# Patient Record
Sex: Male | Born: 1955 | Race: Black or African American | Hispanic: No | Smoking: Former smoker
Health system: Southern US, Community
[De-identification: ages and names within clinical notes are randomized; demographics above are authoritative.]

## PROBLEM LIST (undated history)

## (undated) DIAGNOSIS — K5900 Constipation, unspecified: Secondary | ICD-10-CM

## (undated) DIAGNOSIS — Z21 Asymptomatic human immunodeficiency virus [HIV] infection status: Secondary | ICD-10-CM

## (undated) DIAGNOSIS — E559 Vitamin D deficiency, unspecified: Secondary | ICD-10-CM

## (undated) DIAGNOSIS — N400 Enlarged prostate without lower urinary tract symptoms: Secondary | ICD-10-CM

## (undated) DIAGNOSIS — E049 Nontoxic goiter, unspecified: Secondary | ICD-10-CM

## (undated) DIAGNOSIS — K219 Gastro-esophageal reflux disease without esophagitis: Secondary | ICD-10-CM

## (undated) HISTORY — DX: Vitamin D deficiency, unspecified: E55.9

## (undated) HISTORY — DX: Constipation, unspecified: K59.00

## (undated) HISTORY — DX: Gastro-esophageal reflux disease without esophagitis: K21.9

## (undated) HISTORY — DX: Nontoxic goiter, unspecified: E04.9

## (undated) HISTORY — DX: Benign prostatic hyperplasia without lower urinary tract symptoms: N40.0

---

## 2013-12-26 ENCOUNTER — Encounter (HOSPITAL_COMMUNITY): Payer: Self-pay | Admitting: Emergency Medicine

## 2013-12-26 ENCOUNTER — Emergency Department (HOSPITAL_COMMUNITY): Payer: Non-veteran care

## 2013-12-26 ENCOUNTER — Emergency Department (HOSPITAL_COMMUNITY)
Admission: EM | Admit: 2013-12-26 | Discharge: 2013-12-26 | Disposition: A | Discharge status: Home / Self Care | Payer: Non-veteran care | Attending: Emergency Medicine | Admitting: Emergency Medicine

## 2013-12-26 DIAGNOSIS — R0789 Other chest pain: Secondary | ICD-10-CM

## 2013-12-26 DIAGNOSIS — R0602 Shortness of breath: Secondary | ICD-10-CM | POA: Insufficient documentation

## 2013-12-26 DIAGNOSIS — R071 Chest pain on breathing: Secondary | ICD-10-CM | POA: Insufficient documentation

## 2013-12-26 DIAGNOSIS — Z21 Asymptomatic human immunodeficiency virus [HIV] infection status: Secondary | ICD-10-CM | POA: Insufficient documentation

## 2013-12-26 DIAGNOSIS — F17200 Nicotine dependence, unspecified, uncomplicated: Secondary | ICD-10-CM | POA: Insufficient documentation

## 2013-12-26 DIAGNOSIS — R61 Generalized hyperhidrosis: Secondary | ICD-10-CM | POA: Insufficient documentation

## 2013-12-26 HISTORY — DX: Asymptomatic human immunodeficiency virus (hiv) infection status: Z21

## 2013-12-26 LAB — CBC WITH DIFFERENTIAL/PLATELET
Basophils Absolute: 0 10*3/uL (ref 0.0–0.1)
Basophils Relative: 0 % (ref 0–1)
EOS ABS: 0 10*3/uL (ref 0.0–0.7)
Eosinophils Relative: 1 % (ref 0–5)
HEMATOCRIT: 42.3 % (ref 39.0–52.0)
Hemoglobin: 14.8 g/dL (ref 13.0–17.0)
Lymphocytes Relative: 28 % (ref 12–46)
Lymphs Abs: 1.5 10*3/uL (ref 0.7–4.0)
MCH: 30.8 pg (ref 26.0–34.0)
MCHC: 35 g/dL (ref 30.0–36.0)
MCV: 87.9 fL (ref 78.0–100.0)
MONO ABS: 0.5 10*3/uL (ref 0.1–1.0)
MONOS PCT: 10 % (ref 3–12)
Neutro Abs: 3.2 10*3/uL (ref 1.7–7.7)
Neutrophils Relative %: 61 % (ref 43–77)
Platelets: 189 10*3/uL (ref 150–400)
RBC: 4.81 MIL/uL (ref 4.22–5.81)
RDW: 13.9 % (ref 11.5–15.5)
WBC: 5.2 10*3/uL (ref 4.0–10.5)

## 2013-12-26 LAB — TROPONIN I: Troponin I: <0.3 ng/mL (ref <0.30)

## 2013-12-26 LAB — BASIC METABOLIC PANEL
BUN: 16 mg/dL (ref 6–23)
CO2: 30 meq/L (ref 19–32)
Calcium: 10 mg/dL (ref 8.4–10.5)
Chloride: 104 mEq/L (ref 96–112)
Creatinine, Ser: 1.18 mg/dL (ref 0.50–1.35)
GFR calc Af Amer: 78 mL/min — ABNORMAL LOW (ref >90)
GFR, EST NON AFRICAN AMERICAN: 67 mL/min — AB (ref >90)
GLUCOSE: 107 mg/dL — AB (ref 70–99)
Potassium: 4.5 mEq/L (ref 3.7–5.3)
Sodium: 145 mEq/L (ref 137–147)

## 2013-12-26 MED ORDER — IBUPROFEN 800 MG PO TABS
800.0000 mg | ORAL_TABLET | Freq: Once | ORAL | Status: AC
  Administered 2013-12-26: 800 mg via ORAL
  Filled 2013-12-26: qty 1

## 2013-12-26 NOTE — Discharge Instructions (Signed)
Try ice and heat to your chest to help relax your chest muscles. Take the ibuprofen and cyclobenzaprine you have at home for your chest wall pain. Recheck if you get a fever, cough, struggle to breathe or feel worse.    Chest Wall Pain Chest wall pain is pain in or around the bones and muscles of your chest. It may take up to 6 weeks to get better. It may take longer if you must stay physically active in your work and activities.  CAUSES  Chest wall pain may happen on its own. However, it may be caused by:  A viral illness like the flu.  Injury.  Coughing.  Exercise.  Arthritis.  Fibromyalgia.  Shingles. HOME CARE INSTRUCTIONS   Avoid overtiring physical activity. Try not to strain or perform activities that cause pain. This includes any activities using your chest or your abdominal and side muscles, especially if heavy weights are used.  Put ice on the sore area.  Put ice in a plastic bag.  Place a towel between your skin and the bag.  Leave the ice on for 15-20 minutes per hour while awake for the first 2 days.  Only take over-the-counter or prescription medicines for pain, discomfort, or fever as directed by your caregiver. SEEK IMMEDIATE MEDICAL CARE IF:   Your pain increases, or you are very uncomfortable.  You have a fever.  Your chest pain becomes worse.  You have new, unexplained symptoms.  You have nausea or vomiting.  You feel sweaty or lightheaded.  You have a cough with phlegm (sputum), or you cough up blood. MAKE SURE YOU:   Understand these instructions.  Will watch your condition.  Will get help right away if you are not doing well or get worse. Document Released: 11/30/2005 Document Revised: 02/22/2012 Document Reviewed: 07/27/2011 [facility] Patient Information 2014 [place], [place].

## 2013-12-26 NOTE — ED Provider Notes (Signed)
CSN: [id number]     Arrival date & time 12/26/13  1545 History  This chart was scribed for Todd Norrie, MD by Jenne Campus, ED Scribe. This patient was seen in room APA08/APA08 and the patient's care was started at 6:27 PM.   Chief Complaint  Patient presents with  . Chest Pain    The history is provided by the patient. No language interpreter was used.    HPI Comments: Todd Cardenas is a 58 y.o. male who presents to the Emergency Department by ambulance from home complaining of persistent left-sided CP described as dull for the past week. He has taken Nyquil believing it to be from a cold with no improvement; although, he denies any fever, cough, sneezing or rhinorrhea. He became concerned when the pain became worse with laying on his left-sided for the past 2 nights with an increase in the dullness. He states that he was driving today when he turned his body in the car to look behind him so he could back up and had a sudden onset tightness across the left chest that caused SOB. He made it home but then drove himself to the ED when he felt SOB and diaphoretic with one episode of breathing deep. Pt states that the pain was aggravated with movement of the left arm and with deep breathing. He reports that "It feels like a rubber band tightening up when I breathe deep". He reports that the pain has improved after taking a nap and he only feels the pain with significant movement such as twisting. He reports that he has experienced prior frequent episodes of dull pain which he has been worked up at the [facility] for. He also admits that he usually doesn't go to the doctor during those episodes believing them to be "pulled muscles". He reports that he has a family h/o TIAs and was told "I probably have had mini-strokes before". He is not on ASA or blood thinners.   PCP [facility] in [place]  Past Medical History  Diagnosis Date  . HIV positive    History reviewed. No pertinent past surgical history. No  family history on file. History  Substance Use Topics  . Smoking status: Current Every Day Smoker    Types: Cigarettes  . Smokeless tobacco: Not on file  . 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 Negative for fever.  HENT: Negative for rhinorrhea, sneezing and sore throat.   Respiratory: Positive for shortness of breath. Negative for cough.   Cardiovascular: Positive for chest pain.  All other systems reviewed and are negative.    Allergies  Review of patient's allergies indicates no known allergies.  Home Medications   Current Outpatient Rx  Name  Route  Sig  Dispense  Refill  . efavirenz-emtricitabine-tenofovir (ATRIPLA) [phone] MG per tablet   Oral   Take 1 tablet by mouth every morning.         . Multiple Vitamin (MULTIVITAMIN WITH MINERALS) TABS tablet   Oral   Take 1 tablet by mouth every morning.         . Pseudoeph-Doxylamine-DM-APAP (NYQUIL MULTI-SYMPTOM PO)   Oral   Take 10-15 mLs by mouth at bedtime as needed (FOR COLD RELIEF).          Triage Vitals: BP 120/77  Pulse 64  Temp(Src) 98.3 F (36.8 C) (Oral)  Resp 16  Ht  6\' 5"  (1.956 m)  Wt 152 lb (68.947 kg)  BMI 18.02 kg/m2  SpO2 99%  Vital signs normal    Physical Exam  Nursing note and vitals reviewed. Constitutional: He is oriented to person, place, and time. He appears well-developed and well-nourished.  Non-toxic appearance. He does not appear ill. No distress.  Tall thin male  HENT:  Head: Normocephalic and atraumatic.  Right Ear: External ear normal.  Left Ear: External ear normal.  Nose: Nose normal. No mucosal edema or rhinorrhea.  Mouth/Throat: Oropharynx is clear and moist and mucous membranes are normal. No dental abscesses or uvula swelling.  Eyes: Conjunctivae and EOM are normal. Pupils are equal, round, and reactive to light.  Neck: Normal range of motion  and full passive range of motion without pain. Neck supple.  Cardiovascular: Normal rate, regular rhythm and normal heart sounds.  Exam reveals no gallop and no friction rub.   No murmur heard. Pulmonary/Chest: Effort normal and breath sounds normal. No respiratory distress. He has no wheezes. He has no rhonchi. He has no rales. He exhibits tenderness (diffuse left anterior chest tenderness). He exhibits no crepitus.    Chest wall tenderness as shown.  Abdominal: Soft. Normal appearance and bowel sounds are normal. He exhibits no distension. There is no tenderness. There is no rebound and no guarding.  Musculoskeletal: Normal range of motion. He exhibits no edema and no tenderness.  Moves all extremities well.   Neurological: He is alert and oriented to person, place, and time. He has normal strength. No cranial nerve deficit.  Skin: Skin is warm, dry and intact. No rash noted. No erythema. No pallor.  Psychiatric: He has a normal mood and affect. His speech is normal and behavior is normal. His mood appears not anxious.    ED Course  Procedures (including critical care time)  Medications  ibuprofen (ADVIL,MOTRIN) tablet 800 mg (800 mg Oral Given 12/26/13 1839)     DIAGNOSTIC STUDIES: Oxygen Saturation is 99% on RA, normal by my interpretation.    COORDINATION OF CARE: 6:30 PM- Informed pt of benign work up.Advised pt that his symptoms seem more like chest wall tenderness. Pt declined all prescription medications stating that he has muscle relaxants (flexeril)  and Ibuprofen at home. Discussed treatment plan which includes ice and heat with pt at bedside and pt agreed to plan. Addressed symptoms to return for and [facility]     Results for orders placed during the hospital encounter of 12/26/13  CBC WITH DIFFERENTIAL      Result Value Range   WBC 5.2  4.0 - 10.5 K/uL   RBC 4.81  4.22 - 5.81 MIL/uL   Hemoglobin 14.8  13.0 - 17.0 g/dL   HCT 42.3  39.0 - 52.0 %    MCV 87.9  78.0 - 100.0 fL   MCH 30.8  26.0 - 34.0 pg   MCHC 35.0  30.0 - 36.0 g/dL   RDW 13.9  11.5 - 15.5 %   Platelets 189  150 - 400 K/uL   Neutrophils Relative % 61  43 - 77 %   Neutro Abs 3.2  1.7 - 7.7 K/uL   Lymphocytes Relative 28  12 - 46 %   Lymphs Abs 1.5  0.7 - 4.0 K/uL   Monocytes Relative 10  3 - 12 %   Monocytes Absolute 0.5  0.1 - 1.0 K/uL   Eosinophils Relative 1  0 - 5 %   Eosinophils Absolute 0.0  0.0 - 0.7 K/uL   Basophils Relative 0  0 - 1 %   Basophils Absolute 0.0  0.0 - 0.1 K/uL  BASIC METABOLIC PANEL      Result Value Range   Sodium 145  137 - 147 mEq/L   Potassium 4.5  3.7 - 5.3 mEq/L   Chloride 104  96 - 112 mEq/L   CO2 30  19 - 32 mEq/L   Glucose, Bld 107 (*) 70 - 99 mg/dL   BUN 16  6 - 23 mg/dL   Creatinine, Ser 1.18  0.50 - 1.35 mg/dL   Calcium 10.0  8.4 - 10.5 mg/dL   GFR calc non Af Amer 67 (*) >90 mL/min   GFR calc Af Amer 78 (*) >90 mL/min  TROPONIN I      Result Value Range   Troponin I <0.30  <0.30 ng/mL    Laboratory interpretation all normal   Dg Chest 2 View  12/26/2013   CLINICAL DATA:  Dull chest pain for 1 week, felt a pull in his left ribs while driving today, history smoking  EXAM: CHEST  2 VIEW  COMPARISON:  None  FINDINGS: Normal heart size, mediastinal contours, and pulmonary vascularity.  Lungs slightly hyperinflated with minimal atelectasis at the left costophrenic angle.  No acute infiltrate, pleural effusion or pneumothorax.  No acute osseous findings.  IMPRESSION: No acute abnormalities.   Electronically Signed   By: Lavonia Dana M.D.   On: 12/26/2013 16:44   Dg Ribs Unilateral Left  12/26/2013   CLINICAL DATA:  Dull chest pain for 1 week, felt a pull in his left ribs today while driving, history smoking, HIV  EXAM: LEFT RIBS - 2 VIEW  COMPARISON:  Chest radiograph 12/26/2013  FINDINGS: Osseous mineralization normal.  No rib fracture or bone destruction.  Left lung appears clear.  IMPRESSION: Normal exam.   Electronically Signed    By: Lavonia Dana M.D.   On: 12/26/2013 16:59     EKG Interpretation    Date/Time:  Tuesday December 26 2013 16:18:16 EST Ventricular Rate:  72 PR Interval:  174 QRS Duration: 92 QT Interval:  374 QTC Calculation: 409 R Axis:   55 Text Interpretation:  Normal sinus rhythm Normal ECG No previous ECGs available Confirmed by Chanc Kervin  MD-I, Deago Burruss ([id number]) on 12/26/2013 6:40:45 PM            MDM   1. Chest wall pain      Plan discharge   Rolland Porter, MD, Alanson Aly, MD 12/26/13 [phone]

## 2013-12-26 NOTE — ED Notes (Signed)
Dull pain to left side of chest x 1 wk.  Reports turned around today and felt something "pull" that caught his breath".  States pain has been constant since worse with movement and breathing.

## 2017-10-05 ENCOUNTER — Emergency Department (HOSPITAL_COMMUNITY): Payer: Non-veteran care

## 2017-10-05 ENCOUNTER — Encounter (HOSPITAL_COMMUNITY): Payer: Self-pay | Admitting: Emergency Medicine

## 2017-10-05 ENCOUNTER — Emergency Department (HOSPITAL_COMMUNITY)
Admission: EM | Admit: 2017-10-05 | Discharge: 2017-10-05 | Disposition: A | Discharge status: Home / Self Care | Payer: Non-veteran care | Attending: Emergency Medicine | Admitting: Emergency Medicine

## 2017-10-05 DIAGNOSIS — B20 Human immunodeficiency virus [HIV] disease: Secondary | ICD-10-CM | POA: Diagnosis not present

## 2017-10-05 DIAGNOSIS — R202 Paresthesia of skin: Secondary | ICD-10-CM | POA: Diagnosis not present

## 2017-10-05 DIAGNOSIS — Z79899 Other long term (current) drug therapy: Secondary | ICD-10-CM | POA: Insufficient documentation

## 2017-10-05 DIAGNOSIS — F17210 Nicotine dependence, cigarettes, uncomplicated: Secondary | ICD-10-CM | POA: Insufficient documentation

## 2017-10-05 DIAGNOSIS — R0789 Other chest pain: Secondary | ICD-10-CM | POA: Insufficient documentation

## 2017-10-05 DIAGNOSIS — R079 Chest pain, unspecified: Secondary | ICD-10-CM | POA: Diagnosis present

## 2017-10-05 DIAGNOSIS — R200 Anesthesia of skin: Secondary | ICD-10-CM

## 2017-10-05 LAB — CBC
HCT: 38 % — ABNORMAL LOW (ref 39.0–52.0)
Hemoglobin: 12.4 g/dL — ABNORMAL LOW (ref 13.0–17.0)
MCH: 28.9 pg (ref 26.0–34.0)
MCHC: 32.6 g/dL (ref 30.0–36.0)
MCV: 88.6 fL (ref 78.0–100.0)
PLATELETS: 145 10*3/uL — AB (ref 150–400)
RBC: 4.29 MIL/uL (ref 4.22–5.81)
RDW: 14 % (ref 11.5–15.5)
WBC: 4 10*3/uL (ref 4.0–10.5)

## 2017-10-05 LAB — BASIC METABOLIC PANEL
Anion gap: 5 (ref 5–15)
BUN: 10 mg/dL (ref 6–20)
CO2: 26 mmol/L (ref 22–32)
Calcium: 8.8 mg/dL — ABNORMAL LOW (ref 8.9–10.3)
Chloride: 107 mmol/L (ref 101–111)
Creatinine, Ser: 1.01 mg/dL (ref 0.61–1.24)
GFR calc Af Amer: >60 mL/min (ref >60)
GFR calc non Af Amer: >60 mL/min (ref >60)
Glucose, Bld: 107 mg/dL — ABNORMAL HIGH (ref 65–99)
Potassium: 4 mmol/L (ref 3.5–5.1)
SODIUM: 138 mmol/L (ref 135–145)

## 2017-10-05 LAB — TROPONIN I: Troponin I: <0.03 ng/mL (ref <0.03)

## 2017-10-05 NOTE — ED Provider Notes (Signed)
[facility] EMERGENCY DEPARTMENT Provider Note   CSN: [id number] Arrival date & time: 10/05/17  1039     History   Chief Complaint Chief Complaint  Patient presents with  . Chest Pain    HPI Todd Cardenas is a 61 y.o. male.  Right-sided chest pain since early this morning.   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  Arm symptoms are worse with sitting up and improved with lying down. No crushing substernal chest pain, diaphoresis, dyspnea, nausea. No previous history of cardiac disease. Severity is mild. No previous cardiac disease, diabetes, hypertension. He is HIV positive.      Past Medical History:  Diagnosis Date  . HIV positive ([place])     There are no active problems to display for this patient.   History reviewed. No pertinent surgical history.     Home Medications    Prior to Admission medications   Medication Sig Start Date End Date Taking? Authorizing Provider  efavirenz-emtricitabine-tenofovir (ATRIPLA) [phone] MG per tablet Take 1 tablet by mouth every morning.   Yes [provider]  Multiple Vitamin (MULTIVITAMIN WITH MINERALS) TABS tablet Take 1 tablet by mouth every morning.   Yes [provider]  Vitamin D, Cholecalciferol, 1000 units TABS Take 1,000 Units by mouth 2 (two) times daily.   Yes [provider]    Family History No family history on file.  Social History Social History  Substance Use Topics  . Smoking status: Current Every Day Smoker    Packs/day: 1.00    Types: Cigarettes  . Smokeless tobacco: Never Used  . Alcohol use Yes     Comment: occ     Allergies   Amoxicillin   Review of Systems Review of Systems  All other systems reviewed and are negative.    Physical Exam Updated Vital Signs BP 125/88   Pulse (!) 55   Temp (!) 97.4 F (36.3 C) (Oral)   Resp 11   Ht 6\' 6"  (1.981 m)   Wt 78  kg (172 lb)   SpO2 99%   BMI 19.88 kg/m   Physical Exam  Constitutional: He is oriented to person, place, and time. He appears well-developed and well-nourished.  HENT:  Head: Normocephalic and atraumatic.  Eyes: Conjunctivae are normal.  Neck: Neck supple.  Cardiovascular: Normal rate and regular rhythm.   Pulmonary/Chest: Effort normal and breath sounds normal.  Abdominal: Soft. Bowel sounds are normal.  Musculoskeletal: Normal range of motion.  Neurological: He is alert and oriented to person, place, and time.  Full range of motion of arms and legs without deficits.  Skin: Skin is warm and dry.  Psychiatric: He has a normal mood and affect. His behavior is normal.  Nursing note and vitals reviewed.    ED Treatments / Results  Labs (all labs ordered are listed, but only abnormal results are displayed) Labs Reviewed  BASIC METABOLIC PANEL - Abnormal; Notable for the following:       Result Value   Glucose, Bld 107 (*)    Calcium 8.8 (*)    All other components within normal limits  CBC - Abnormal; Notable for the following:    Hemoglobin 12.4 (*)    HCT 38.0 (*)    Platelets 145 (*)    All other components within normal limits  TROPONIN I    EKG  EKG Interpretation  Date/Time:  Tuesday October 05 2017 10:57:04 EDT Ventricular Rate:  63 PR Interval:    QRS Duration: 105 QT Interval:  406 QTC Calculation: 416 R Axis:   64 Text Interpretation:  Sinus rhythm RSR' in V1 or V2, right VCD or RVH ST elevation, consider inferior injury Baseline wander in lead(s) III aVF V1 V4 Confirmed by Nat Christen [phone] on 10/05/2017 1:14:56 PM       Radiology Dg Chest 2 View  Result Date: 10/05/2017 CLINICAL DATA:  Right side chest tightness, right arm pain EXAM: CHEST  2 VIEW COMPARISON:  12/26/2013 FINDINGS: Mild hyperinflation. Heart and mediastinal contours are within normal limits. No focal opacities or effusions. No acute bony abnormality. IMPRESSION: Hyperinflation.  No  active cardiopulmonary disease. Electronically Signed   By: Rolm Baptise M.D.   On: 10/05/2017 11:26    Procedures Procedures (including critical care time)  Medications Ordered in ED Medications - No data to display   Initial Impression / Assessment and Plan / ED Course  I have reviewed the triage vital signs and the nursing notes.  Pertinent labs & imaging results that were available during my care of the patient were reviewed by me and considered in my medical decision making (see chart for details).     Patient appears well. No neurological deficits noted. EKG, chest x-ray, troponin all negative. He has primary care follow-up at the [facility].  Final Clinical Impressions(s) / ED Diagnoses   Final diagnoses:  Chest pain, unspecified type  Numbness and tingling of right arm    New Prescriptions New Prescriptions   No medications on file     Nat Christen, MD 10/05/17 1440

## 2017-10-05 NOTE — Discharge Instructions (Signed)
Tests showed no life-threatening condition.  Follow-up with the [facility] or return here if worse.

## 2017-10-05 NOTE — ED Triage Notes (Signed)
Patient c/o right side chest tightness that started this morning. Per patient pain in right arm x5 days. 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 Patient states pain "shooting down right arm with numbness." Patient states pain is relieved with laying down. Denies any shortness of breath, nausea, or vomiting. Patient does report some "light headedness" with a headache. Only cardiac hx is heart murmur.

## 2018-04-26 IMAGING — DX DG CHEST 2V
2 series · 2 of 2 positions shown · non-contrast
Comparison: 12/26/2013

CLINICAL DATA: Right side chest tightness, right arm pain

EXAM:
CHEST  2 VIEW

[chest pa]
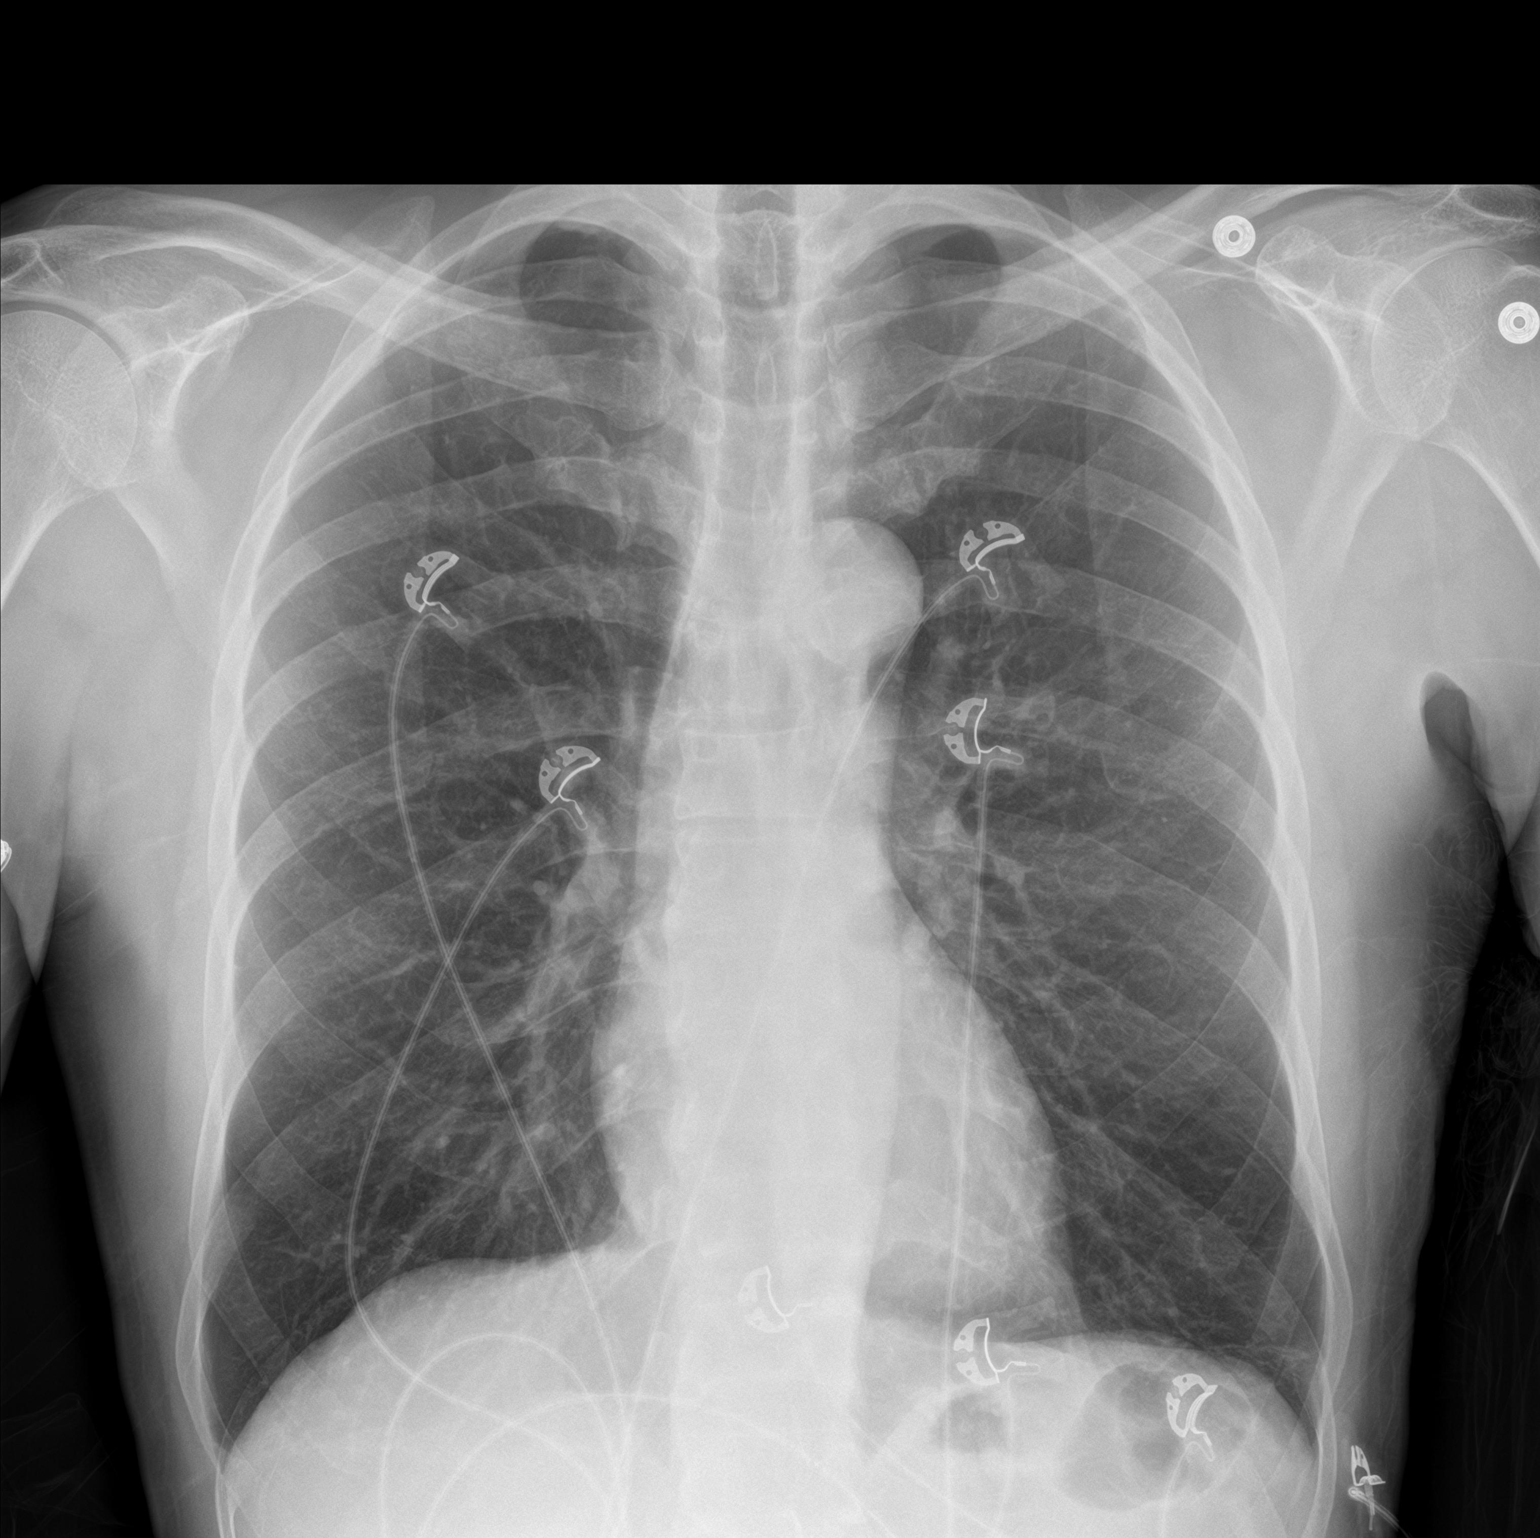

[chest lat]
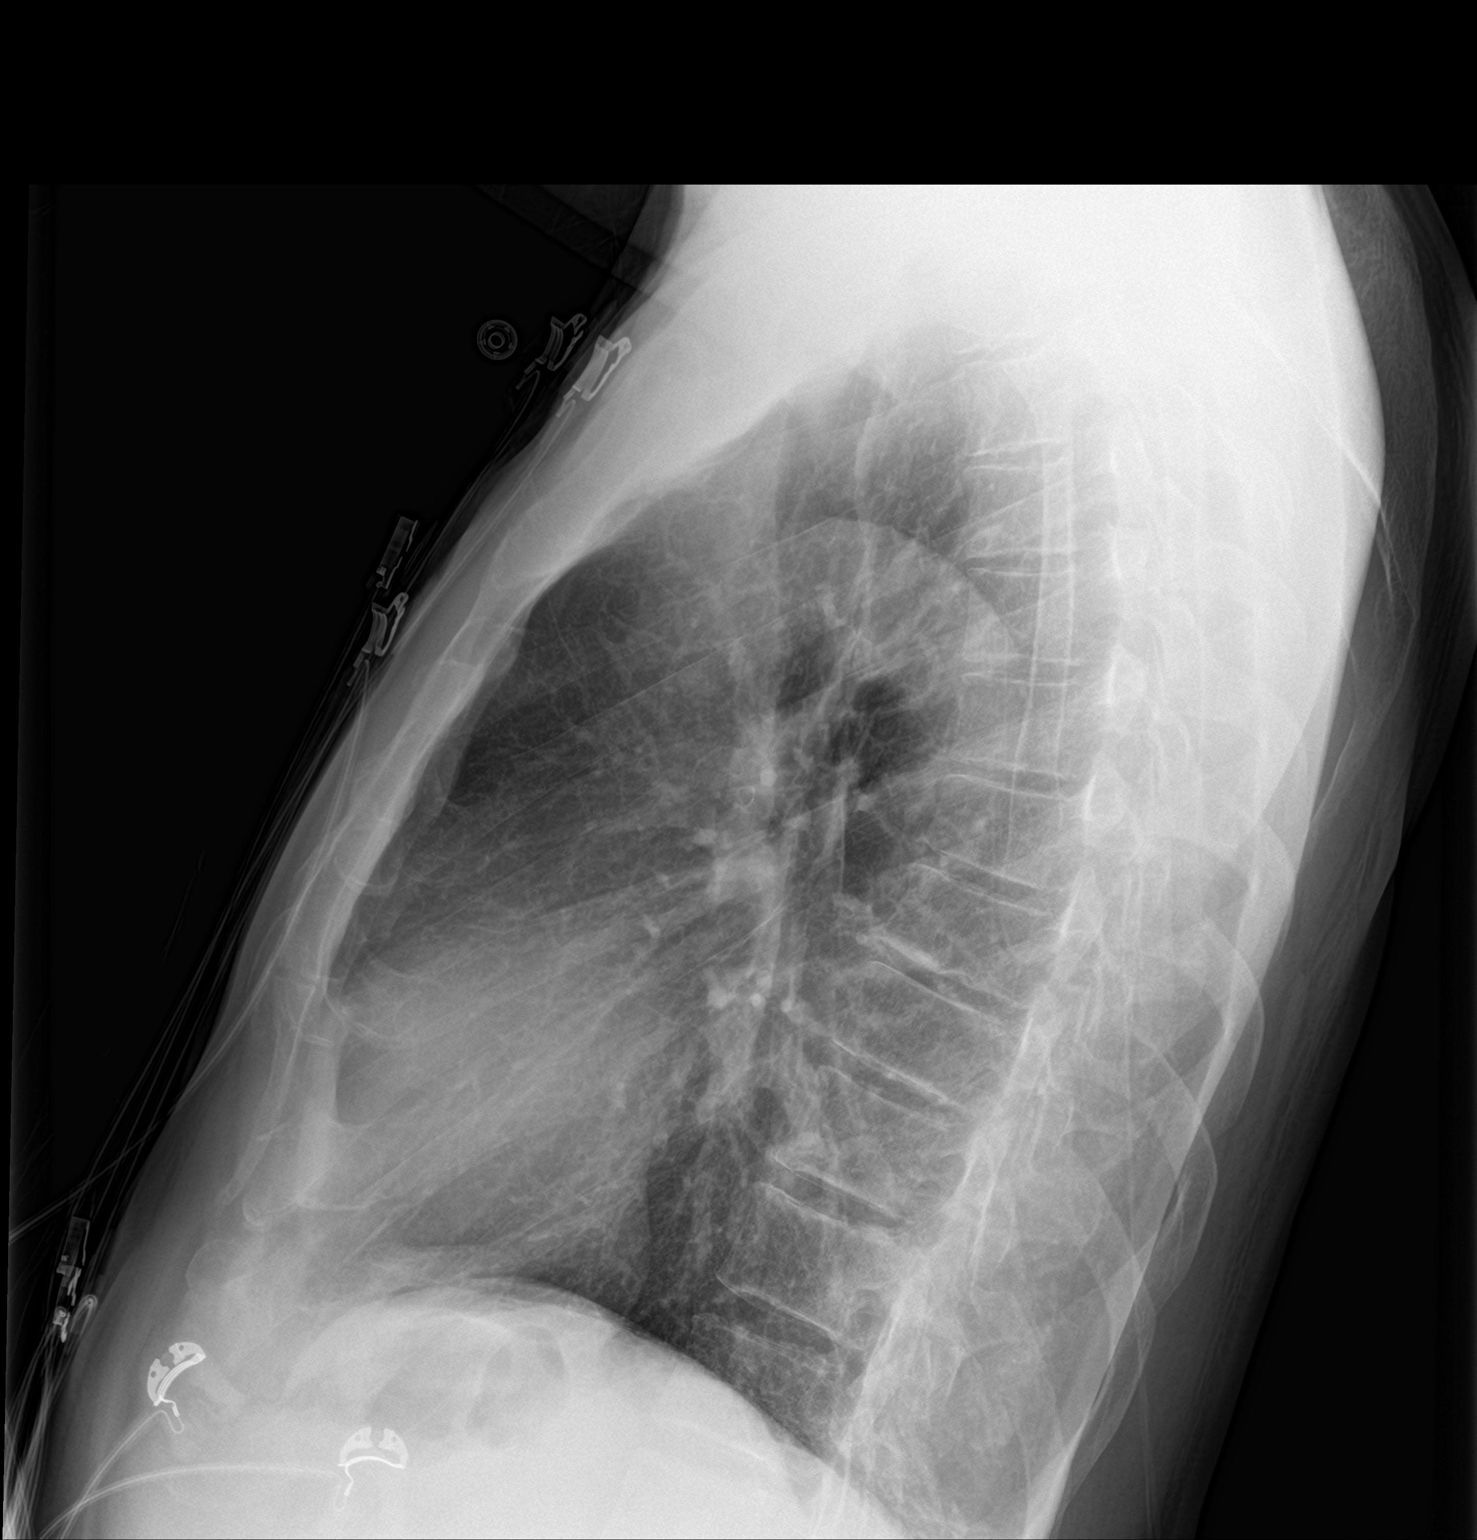

[2 of 2 positions shown; findings below may reference images not displayed]

FINDINGS: Mild hyperinflation. Heart and mediastinal contours are within
normal limits. No focal opacities or effusions. No acute bony
abnormality.
IMPRESSION: Hyperinflation.  No active cardiopulmonary disease.

## 2019-06-01 ENCOUNTER — Other Ambulatory Visit: Payer: Non-veteran care

## 2019-06-01 ENCOUNTER — Other Ambulatory Visit: Payer: Self-pay | Admitting: Internal Medicine

## 2019-06-01 DIAGNOSIS — Z20822 Contact with and (suspected) exposure to covid-19: Secondary | ICD-10-CM

## 2019-06-05 LAB — NOVEL CORONAVIRUS, NAA: SARS-CoV-2, NAA: NOT DETECTED

## 2019-10-20 ENCOUNTER — Other Ambulatory Visit: Payer: Self-pay

## 2019-10-20 DIAGNOSIS — Z20822 Contact with and (suspected) exposure to covid-19: Secondary | ICD-10-CM

## 2019-10-21 LAB — NOVEL CORONAVIRUS, NAA: SARS-CoV-2, NAA: NOT DETECTED

## 2020-01-11 ENCOUNTER — Other Ambulatory Visit: Payer: Self-pay

## 2020-01-11 ENCOUNTER — Ambulatory Visit: Payer: Non-veteran care | Attending: Internal Medicine

## 2020-01-11 ENCOUNTER — Other Ambulatory Visit: Payer: Non-veteran care

## 2020-01-11 DIAGNOSIS — Z20822 Contact with and (suspected) exposure to covid-19: Secondary | ICD-10-CM

## 2020-01-12 LAB — NOVEL CORONAVIRUS, NAA: SARS-CoV-2, NAA: NOT DETECTED

## 2020-02-06 ENCOUNTER — Ambulatory Visit: Payer: Non-veteran care

## 2022-01-05 ENCOUNTER — Encounter: Payer: Self-pay | Admitting: Nurse Practitioner

## 2022-01-05 ENCOUNTER — Ambulatory Visit (INDEPENDENT_AMBULATORY_CARE_PROVIDER_SITE_OTHER): Payer: Medicare HMO | Admitting: Nurse Practitioner

## 2022-01-05 ENCOUNTER — Other Ambulatory Visit: Payer: Self-pay

## 2022-01-05 VITALS — BP 141/86 | HR 79 | Resp 17 | Ht 78.0 in | Wt 222.0 lb

## 2022-01-05 DIAGNOSIS — N401 Enlarged prostate with lower urinary tract symptoms: Secondary | ICD-10-CM

## 2022-01-05 DIAGNOSIS — E782 Mixed hyperlipidemia: Secondary | ICD-10-CM | POA: Insufficient documentation

## 2022-01-05 DIAGNOSIS — R7303 Prediabetes: Secondary | ICD-10-CM | POA: Insufficient documentation

## 2022-01-05 DIAGNOSIS — N400 Enlarged prostate without lower urinary tract symptoms: Secondary | ICD-10-CM | POA: Insufficient documentation

## 2022-01-05 DIAGNOSIS — E785 Hyperlipidemia, unspecified: Secondary | ICD-10-CM | POA: Diagnosis not present

## 2022-01-05 DIAGNOSIS — K5900 Constipation, unspecified: Secondary | ICD-10-CM

## 2022-01-05 DIAGNOSIS — E042 Nontoxic multinodular goiter: Secondary | ICD-10-CM

## 2022-01-05 DIAGNOSIS — K219 Gastro-esophageal reflux disease without esophagitis: Secondary | ICD-10-CM

## 2022-01-05 DIAGNOSIS — R42 Dizziness and giddiness: Secondary | ICD-10-CM

## 2022-01-05 DIAGNOSIS — B20 Human immunodeficiency virus [HIV] disease: Secondary | ICD-10-CM | POA: Diagnosis not present

## 2022-01-05 NOTE — Assessment & Plan Note (Signed)
Currently not on medication he is being followed by urology as [place]

## 2022-01-05 NOTE — Assessment & Plan Note (Signed)
EKG obtained today shows sinus rhythm with a rate of 63 bpm.  [facility] to have [facility].

## 2022-01-05 NOTE — Assessment & Plan Note (Signed)
Takes MiraLAX, Senokot, and FiberCon daily.

## 2022-01-05 NOTE — Assessment & Plan Note (Signed)
[facility] takes Genvoya 150-150-200-10- mg  tablets daily.

## 2022-01-05 NOTE — Assessment & Plan Note (Signed)
States that the condition is benign. Followed by his provider at the [facility]

## 2022-01-05 NOTE — Assessment & Plan Note (Signed)
Patient states that he was previously taking Protonix. He said that he will follow-up next provider at the [place] if he needs to go back on medication. Patient told to avoid spicy foods, caffeinated drinks , tea.  Educational material for GERD provided to patient today

## 2022-01-05 NOTE — Assessment & Plan Note (Signed)
Takes gemfibrozil 600mg  tablet daily

## 2022-01-05 NOTE — Progress Notes (Signed)
New Patient Office Visit  Subjective:  Patient ID: Todd Cardenas, male    DOB: [birth year]  Age: 66 y.o. MRN: [id number]  CC:  Chief Complaint  Patient presents with   [place] stated that he is here to establish care for Social Security. His PCP is at the [place], he will still be going to the [place] for his medications and management of his chronic diseases.  Todd Cardenas presents to establish care. He has been going to [facility].  Pt c/o acid reflux, has burning sensation in his chest,symptoms starts like 30  Minutes after he starts eating. This symptoms has been going on for years.  He states that he was previously taking Protonix but not anymore.  Pt c/o of dizziness that started about 2 weeks ago .  he started going to the [facility] last week. Marland Kitchen He feels dizzy and nausea only at night when he knees down to pray, when he last down straight down across the bed it goes away. He has had symptoms three night in the past two week. He sometimes have chest pain  about every three months, pain comes and goes on its own, . Denies palpitations   HIV. See Dr Sherral Hammers at the [facility] HLD Gemfibrozil 600mg  BID Constipation. Takes miralax , fibercon, senokot.  BPH currently not on medication patient states that he has been followed by urology Prediabetes.  Last A1c was 6.0.  Vitamin d deficiency.  Currently not on medication Former smoker pt states that he does yearly low chest  CT at the [place].  Had colonscopy doen 3 years ago, Has had 2 polyps  sees gastroenterologist at the [place], he sometimes bleed.   Bp has been in the 120's to 130's at home.   He stated that he has had genetic testing done at the [place].   Past Medical History:  Diagnosis Date   HIV positive ([place])     No past surgical history on file.  No family history on file.  Social History   Socioeconomic History   Marital status: Divorced    Spouse name: Not on file   Number of children: Not on file   Years of education: Not on  file   Highest education level: Not on file  Occupational History   Not on file  Tobacco Use   Smoking status: Every Day    Packs/day: 1.00    Types: Cigarettes   Smokeless tobacco: Never  Vaping Use   Vaping Use: Never used  Substance and Sexual Activity   Alcohol use: Yes    Comment: occ   Drug use: No   Sexual activity: Not on file  Other Topics Concern   Not on file  Social History Narrative   Not on file   Social Determinants of Health   Financial Resource Strain: Not on file  Food Insecurity: Not on file  Transportation Needs: Not on file  Physical Activity: Not on file  Stress: Not on file  Social Connections: Not on file  Intimate Partner Violence: Not on file    ROS Review of Systems  Constitutional: Negative.   Respiratory: Negative.    Cardiovascular: Negative.   Gastrointestinal:  Positive for constipation.       Acid reflux   Neurological:  Positive for dizziness. Negative for seizures, syncope, speech difficulty, weakness and numbness.  Psychiatric/Behavioral: Negative.     Objective:   Today's Vitals: BP (!) 141/86    Pulse 79  Resp 17    Ht 6\' 6"  (1.981 m)    Wt 222 lb 0.6 oz (100.7 kg)    SpO2 96%    BMI 25.66 kg/m   Physical Exam Constitutional:      General: He is not in acute distress.    Appearance: He is not ill-appearing, toxic-appearing or diaphoretic.  Cardiovascular:     Rate and Rhythm: Normal rate and regular rhythm.     Pulses: Normal pulses.     Heart sounds: No murmur heard.   No friction rub. No gallop.  Pulmonary:     Effort: No respiratory distress.     Breath sounds: No stridor. No wheezing, rhonchi or rales.  Chest:     Chest wall: No tenderness.  Abdominal:     Palpations: Abdomen is soft.  Musculoskeletal:     Right lower leg: No edema.     Left lower leg: No edema.  Psychiatric:        Mood and Affect: Mood normal.        Behavior: Behavior normal.        Thought Content: Thought content normal.         Judgment: Judgment normal.    Assessment & Plan:   Problem List Items Addressed This Visit   None   Outpatient Encounter Medications as of 01/05/2022  Medication Sig   cetirizine (ZYRTEC) 10 MG tablet Take 10 mg by mouth daily.   elvitegravir-cobicistat-emtricitabine-tenofovir (GENVOYA) 150-150-200-10 MG TABS tablet Take 1 tablet by mouth daily with breakfast.   gemfibrozil (LOPID) 600 MG tablet Take 600 mg by mouth 2 (two) times daily before a meal.   Multiple Vitamin (MULTIVITAMIN WITH MINERALS) TABS tablet Take 1 tablet by mouth every morning.   polycarbophil (FIBERCON) 625 MG tablet Take 625 mg by mouth daily.   polyethylene glycol (MIRALAX / GLYCOLAX) 17 g packet Take 17 g by mouth daily.   senna-docusate (SENOKOT-S) 8.6-50 MG tablet Take 2 tablets by mouth daily.   vitamin E 180 MG (400 UNITS) capsule Take 400 Units by mouth daily.   [DISCONTINUED] efavirenz-emtricitabine-tenofovir (ATRIPLA) 600-200-300 MG per tablet Take 1 tablet by mouth every morning.   [DISCONTINUED] Vitamin D, Cholecalciferol, 1000 units TABS Take 1,000 Units by mouth 2 (two) times daily.   No facility-administered encounter medications on file as of 01/05/2022.    Follow-up: No follow-ups on file.   Renee Rival, FNP

## 2022-01-05 NOTE — Patient Instructions (Signed)

## 2022-01-05 NOTE — Assessment & Plan Note (Signed)
Last a1c was 6.0. Avoid sugar, sweet, soda

## 2022-01-12 ENCOUNTER — Ambulatory Visit: Payer: Non-veteran care | Admitting: Nurse Practitioner

## 2022-03-12 ENCOUNTER — Ambulatory Visit: Payer: Non-veteran care

## 2022-04-14 ENCOUNTER — Encounter: Payer: Self-pay | Admitting: Nurse Practitioner

## 2022-04-15 ENCOUNTER — Other Ambulatory Visit: Payer: Self-pay

## 2022-06-23 ENCOUNTER — Ambulatory Visit (INDEPENDENT_AMBULATORY_CARE_PROVIDER_SITE_OTHER): Payer: Medicare Other | Admitting: Nurse Practitioner

## 2022-06-23 ENCOUNTER — Encounter: Payer: Self-pay | Admitting: Nurse Practitioner

## 2022-06-23 VITALS — BP 142/82 | HR 71 | Ht 78.0 in | Wt 216.0 lb

## 2022-06-23 DIAGNOSIS — K921 Melena: Secondary | ICD-10-CM | POA: Diagnosis not present

## 2022-06-23 DIAGNOSIS — S30861A Insect bite (nonvenomous) of abdominal wall, initial encounter: Secondary | ICD-10-CM

## 2022-06-23 DIAGNOSIS — K219 Gastro-esophageal reflux disease without esophagitis: Secondary | ICD-10-CM | POA: Diagnosis not present

## 2022-06-23 DIAGNOSIS — S0006XA Insect bite (nonvenomous) of scalp, initial encounter: Secondary | ICD-10-CM | POA: Diagnosis not present

## 2022-06-23 DIAGNOSIS — E7849 Other hyperlipidemia: Secondary | ICD-10-CM | POA: Diagnosis not present

## 2022-06-23 DIAGNOSIS — W57XXXA Bitten or stung by nonvenomous insect and other nonvenomous arthropods, initial encounter: Secondary | ICD-10-CM | POA: Diagnosis not present

## 2022-06-23 DIAGNOSIS — B20 Human immunodeficiency virus [HIV] disease: Secondary | ICD-10-CM | POA: Diagnosis not present

## 2022-06-23 DIAGNOSIS — I10 Essential (primary) hypertension: Secondary | ICD-10-CM | POA: Diagnosis not present

## 2022-06-23 DIAGNOSIS — R7303 Prediabetes: Secondary | ICD-10-CM | POA: Diagnosis not present

## 2022-06-23 DIAGNOSIS — K5909 Other constipation: Secondary | ICD-10-CM | POA: Diagnosis not present

## 2022-06-23 MED ORDER — DOXYCYCLINE HYCLATE 100 MG PO TABS: 100.0000 mg | ORAL_TABLET | Freq: Two times a day (BID) | ORAL | 0 refills | Status: DC

## 2022-06-23 NOTE — Assessment & Plan Note (Signed)
Will empirically treat with doxycycline 100 mg twice daily for 10 days. Has a single rash on his right lower abdomen side surrounding skin appears red but not like a typical bull's-eye rash

## 2022-06-23 NOTE — Assessment & Plan Note (Addendum)
Currently on lopid 600 mg twice daily Recently had blood work done at the [place]

## 2022-06-23 NOTE — Progress Notes (Signed)
Todd Cardenas     MRN: [id number]      DOB: 27-Sep-1956   HPI Todd Cardenas with past medical history of HIV, GERD, BPH, constipation, hyperlipidemia is here for follow up and re-evaluation of chronic medical conditions, he continues to follow-up with PCP at the [place]. last visit was in April 2023.  Patient stated that he had blood work done recently ordered by his doctor at the [place]. patient encouraged to send copies of his blood work today's office.    Patient stated that he had noticed blood in his stool twice in June.  Prior to that he had stopped taking MiraLAX because he was going on vacation.  Has chronic constipation which he  takes laxatives for.  Patient currently denies bloody stool abdominal pain nausea vomiting.     Patient stated that he had tick bite to his right lower abdominal area in May 2023, can not provide the exact date .  He stated that he removed the tick.  He currently denies fever chills body aches dizziness, numbness, tingling, confusion. He has been scratching the site though its not itching or painful.    Due for pneumonia vaccine patient encouraged to get the vaccine at his pharmacy          ROS Denies recent fever or chills. Denies sinus pressure, nasal congestion, ear pain or sore throat. Denies chest congestion, productive cough or wheezing. Denies chest pains, palpitations and leg swelling Denies abdominal pain, nausea, vomiting,diarrhea or constipation.   Denies dysuria, frequency, hesitancy or incontinence. Denies joint pain, swelling and limitation in mobility. Denies headaches, seizures, numbness, or tingling. Denies depression, anxiety or insomnia.    PE  BP (!) 142/82   Pulse 71   Ht '6\' 6"'$  (1.981 m)   Wt 216 lb (98 kg)   SpO2 95%   BMI 24.96 kg/m   Patient alert and oriented and in no cardiopulmonary distress.  Chest: Clear to auscultation bilaterally.  CVS: S1, S2 no murmurs, no S3.Regular rate.  ABD: Soft non tender.   Ext: No  edema  MS: Adequate ROM spine, shoulders, hips and knees.  Skin: a single rash noted on right lower abdomen, has some clear drainage, surrounding skin appear red.   Psych: Good eye contact, normal affect. Memory intact not anxious or depressed appearing.  CNS: CN 2-12 intact, power,  normal throughout.no focal deficits noted.   Assessment & Plan  Constipation Chronic condition Well-controlled on FiberCon 625 mg daily, MiraLAX 17 g daily, Senokot 2 tablets daily.  Continue current medication   Hyperlipidemia Currently on lopid 600 mg twice daily Recently had blood work done at the [place]   HIV (human immunodeficiency virus infection) ([place]) Patient remains asymptomatic  Plan encouraged to maintain close follow-up with infectious disease doctors Takes Genvoya 1 tablet daily  High blood pressure BP Readings from Last 3 Encounters:  06/23/22 (!) 142/82  01/05/22 (!) 141/86  10/05/17 114/82  Patient declined blood pressure medication today.  He stated that he will follow-up on this with his PCP at the [facility] Patient encouraged to monitor his blood pressure at home and call the office if his blood pressure stays greater than 140/90 DASH diet advised engage in regular exercises  GERD (gastroesophageal reflux disease) condition well-controlled Currently not on medication Avoid fried fatty foods and other offending foods  Bloody stool Currently denies blood in the stool He had stopped taking MiraLAX before he noted blood in the stool  Blood in the stool probably due  to to constipation and straining with BM  Patient encouraged to notify the office if he again  starts to  notice blood in his stool.   Tick bite of abdomen Will empirically treat with doxycycline 100 mg twice daily for 10 days. Has a single rash on his right lower abdomen side surrounding skin appears red but not like a typical bull's-eye rash

## 2022-06-23 NOTE — Assessment & Plan Note (Addendum)
Chronic condition Well-controlled on FiberCon 625 mg daily, MiraLAX 17 g daily, Senokot 2 tablets daily.  Continue current medication

## 2022-06-23 NOTE — Assessment & Plan Note (Addendum)
Patient remains asymptomatic  Plan encouraged to maintain close follow-up with infectious disease doctors Takes Genvoya 1 tablet daily

## 2022-06-23 NOTE — Assessment & Plan Note (Addendum)
Currently denies blood in the stool He had stopped taking MiraLAX before he noted blood in the stool  Blood in the stool probably due to to constipation and straining with BM  Patient encouraged to notify the office if he again  starts to  notice blood in his stool.

## 2022-06-23 NOTE — Patient Instructions (Addendum)
Please monitor your blood daily at home, please call the office if your blood pressure stays above 140/90.  Pease get your pneumonia vaccine at your pharmacy   Please take doxycycline '100mg'$  twice daily for 10 for your tick bite rash.     It is important that you exercise regularly at least 30 minutes 5 times a week.  Think about what you will eat, plan ahead. Choose " clean, green, fresh or frozen" over canned, processed or packaged foods which are more sugary, salty and fatty. 70 to 75% of food eaten should be vegetables and fruit. Three meals at set times with snacks allowed between meals, but they must be fruit or vegetables. Aim to eat over a 12 hour period , example 7 am to 7 pm, and STOP after  your last meal of the day. Drink water,generally about 64 ounces per day, no other drink is as healthy. Fruit juice is best enjoyed in a healthy way, by EATING the fruit.  Thanks for choosing [facility], we consider it a privelige to serve you.

## 2022-06-23 NOTE — Assessment & Plan Note (Addendum)
BP Readings from Last 3 Encounters:  06/23/22 (!) 142/82  01/05/22 (!) 141/86  10/05/17 114/82  Patient declined blood pressure medication today.  He stated that he will follow-up on this with his PCP at the [facility] Patient encouraged to monitor his blood pressure at home and call the office if his blood pressure stays greater than 140/90 DASH diet advised engage in regular exercises

## 2022-06-23 NOTE — Assessment & Plan Note (Addendum)
condition well-controlled Currently not on medication Avoid fried fatty foods and other offending foods

## 2022-07-09 ENCOUNTER — Encounter: Payer: Self-pay | Admitting: Nurse Practitioner

## 2022-07-10 NOTE — Telephone Encounter (Signed)
Please look into this since he is [place] per [employer]

## 2022-08-05 LAB — HEPATIC FUNCTION PANEL
ALT: 38 U/L (ref 10–40)
AST: 30 (ref 14–40)
Alkaline Phosphatase: 153 — AB (ref 25–125)
Bilirubin, Total: 0.5

## 2022-08-05 LAB — COMPREHENSIVE METABOLIC PANEL
Calcium: 9.7 (ref 8.7–10.7)
eGFR: 48

## 2022-08-05 LAB — CBC AND DIFFERENTIAL: Hemoglobin: 14 (ref 13.5–17.5)

## 2022-08-05 LAB — BASIC METABOLIC PANEL
CO2: 26 — AB (ref 13–22)
Chloride: 110 — AB (ref 99–108)
Creatinine: 1.6 — AB (ref <=1.3)
Glucose: 99
Sodium: 141 (ref 137–147)

## 2022-08-24 ENCOUNTER — Encounter: Payer: Self-pay | Admitting: Nurse Practitioner

## 2022-08-25 ENCOUNTER — Encounter: Payer: Self-pay | Admitting: Nurse Practitioner

## 2022-08-25 NOTE — Telephone Encounter (Signed)
Please assist in this

## 2022-09-10 ENCOUNTER — Encounter: Payer: Self-pay | Admitting: Nurse Practitioner

## 2022-09-11 NOTE — Telephone Encounter (Signed)
FYI

## 2022-10-08 ENCOUNTER — Encounter: Payer: Self-pay | Admitting: Internal Medicine

## 2022-10-08 ENCOUNTER — Ambulatory Visit (INDEPENDENT_AMBULATORY_CARE_PROVIDER_SITE_OTHER): Payer: Medicare Other | Admitting: Internal Medicine

## 2022-10-08 VITALS — BP 146/84 | HR 74 | Resp 18 | Ht 72.0 in | Wt 212.8 lb

## 2022-10-08 DIAGNOSIS — S92909A Unspecified fracture of unspecified foot, initial encounter for closed fracture: Secondary | ICD-10-CM

## 2022-10-08 DIAGNOSIS — B20 Human immunodeficiency virus [HIV] disease: Secondary | ICD-10-CM

## 2022-10-08 DIAGNOSIS — I10 Essential (primary) hypertension: Secondary | ICD-10-CM | POA: Diagnosis not present

## 2022-10-08 DIAGNOSIS — G8929 Other chronic pain: Secondary | ICD-10-CM | POA: Diagnosis not present

## 2022-10-08 DIAGNOSIS — R634 Abnormal weight loss: Secondary | ICD-10-CM | POA: Insufficient documentation

## 2022-10-08 DIAGNOSIS — E559 Vitamin D deficiency, unspecified: Secondary | ICD-10-CM | POA: Insufficient documentation

## 2022-10-08 DIAGNOSIS — N411 Chronic prostatitis: Secondary | ICD-10-CM | POA: Insufficient documentation

## 2022-10-08 DIAGNOSIS — K08421 Partial loss of teeth due to periodontal diseases, class I: Secondary | ICD-10-CM | POA: Insufficient documentation

## 2022-10-08 DIAGNOSIS — K036 Deposits [accretions] on teeth: Secondary | ICD-10-CM | POA: Insufficient documentation

## 2022-10-08 DIAGNOSIS — K05322 Chronic periodontitis, generalized, moderate: Secondary | ICD-10-CM | POA: Insufficient documentation

## 2022-10-08 DIAGNOSIS — G5600 Carpal tunnel syndrome, unspecified upper limb: Secondary | ICD-10-CM | POA: Insufficient documentation

## 2022-10-08 DIAGNOSIS — R5383 Other fatigue: Secondary | ICD-10-CM | POA: Insufficient documentation

## 2022-10-08 DIAGNOSIS — R519 Headache, unspecified: Secondary | ICD-10-CM

## 2022-10-08 DIAGNOSIS — F4310 Post-traumatic stress disorder, unspecified: Secondary | ICD-10-CM | POA: Insufficient documentation

## 2022-10-08 DIAGNOSIS — F411 Generalized anxiety disorder: Secondary | ICD-10-CM | POA: Insufficient documentation

## 2022-10-08 DIAGNOSIS — D126 Benign neoplasm of colon, unspecified: Secondary | ICD-10-CM | POA: Insufficient documentation

## 2022-10-08 DIAGNOSIS — M545 Low back pain, unspecified: Secondary | ICD-10-CM | POA: Insufficient documentation

## 2022-10-08 DIAGNOSIS — G603 Idiopathic progressive neuropathy: Secondary | ICD-10-CM

## 2022-10-08 DIAGNOSIS — D3709 Neoplasm of uncertain behavior of other specified sites of the oral cavity: Secondary | ICD-10-CM | POA: Insufficient documentation

## 2022-10-08 HISTORY — DX: Unspecified fracture of unspecified foot, initial encounter for closed fracture: S92.909A

## 2022-10-08 NOTE — Assessment & Plan Note (Signed)
On Genvoya

## 2022-10-08 NOTE — Patient Instructions (Addendum)
Please start taking Amlodipine 5 mg - please discuss with [place].  Please follow DASH diet and ambulate as tolerated.  Please continue taking other medications as prescribed.

## 2022-10-08 NOTE — Progress Notes (Signed)
Established Patient Office Visit  Subjective:  Patient ID: Todd Cardenas, male    DOB: Apr 14, 1956  Age: 66 y.o. MRN: [id number]  CC:  Chief Complaint  Patient presents with   Follow-up    Follow up bp check     HPI Doyt Castellana is a 66 y.o. male with past medical history of HTN, HIV, HLD and chronic headache who presents for f/u of her chronic medical conditions.  HTN: His BP has been elevated during last few office visits.  He also sees [place] physician, and has had borderline elevated BP at [facility] clinic as well.  He has brought home BP readings, which also show BP around 140s-150s/90s on some days.  He prefers to get medicines through [place] clinic.  I offered sending short supply to local pharmacy, but he prefers to get it through the [place] physician.  He currently denies any headache, dizziness, chest pain, dyspnea or palpitations.  Past Medical History:  Diagnosis Date   BPH (benign prostatic hyperplasia)    BPH (benign prostatic hyperplasia)    Closed fracture of foot 10/08/2022   Constipation    GERD (gastroesophageal reflux disease)    Goiter    HIV positive (HCC)    Vitamin D deficiency     History reviewed. No pertinent surgical history.  Family History  Problem Relation Age of Onset   Diabetes Mother    Arthritis Mother    Hypertension Mother    Hyperlipidemia Mother    Throat cancer Father    Alcohol abuse Father    Thyroid disease Sister    Hyperlipidemia Sister    Benign prostatic hyperplasia Brother    Lung cancer Maternal Aunt    Lung cancer Maternal Uncle    Lung cancer Paternal Aunt    Lung cancer Paternal Uncle    Prostate cancer Neg Hx     Social History   Socioeconomic History   Marital status: Divorced    Spouse name: Not on file   Number of children: Not on file   Years of education: Not on file   Highest education level: Not on file  Occupational History   Not on file  Tobacco Use   Smoking status: Former    Packs/day: 1.00    Years: 30.00     Total pack years: 30.00    Types: Cigarettes    Quit date: 04/17/2020    Years since quitting: 2.4   Smokeless tobacco: Never   Tobacco comments:    He quit on 04/17/2020. He smoked 2 packs a day for 30 years.   Vaping Use   Vaping Use: Never used  Substance and Sexual Activity   Alcohol use: Yes    Comment: occ   Drug use: No   Sexual activity: Not Currently  Other Topics Concern   Not on file  Social History Narrative   Lives alone, retired   International aid/development worker of [employer] Strain: Not on file  Food Insecurity: Not on file  Transportation Needs: Not on file  Physical Activity: Not on file  Stress: Not on file  Social Connections: Not on file  Intimate Partner Violence: Not on file    Outpatient Medications Prior to Visit  Medication Sig Dispense Refill   acetaminophen (TYLENOL) 325 MG tablet Take 650 mg by mouth every 6 (six) hours as needed. Take 2 tablets by mouth two times a day as needed for headaches     cetirizine (ZYRTEC) 10 MG tablet Take 10  mg by mouth daily.     cyclobenzaprine (FLEXERIL) 10 MG tablet Take 10 mg by mouth 3 (three) times daily as needed for muscle spasms. Take one tablet by mouth two times a day as needed     elvitegravir-cobicistat-emtricitabine-tenofovir (GENVOYA) 150-150-200-10 MG TABS tablet Take 1 tablet by mouth daily with breakfast.     gabapentin (NEURONTIN) 100 MG capsule Take 100 mg by mouth 3 (three) times daily. Take one capsule by mouth two times a day     gemfibrozil (LOPID) 600 MG tablet Take 600 mg by mouth 2 (two) times daily before a meal.     Multiple Vitamin (MULTIVITAMIN WITH MINERALS) TABS tablet Take 1 tablet by mouth every morning.     polycarbophil (FIBERCON) 625 MG tablet Take 625 mg by mouth daily.     polyethylene glycol (MIRALAX / GLYCOLAX) 17 g packet Take 17 g by mouth daily.     senna-docusate (SENOKOT-S) 8.6-50 MG tablet Take 2 tablets by mouth daily.     topiramate (TOPAMAX) 25 MG tablet Take 25  mg by mouth 2 (two) times daily. Take one tablet by mouth at bedtime     vitamin E 180 MG (400 UNITS) capsule Take 400 Units by mouth daily.     doxycycline (VIBRA-TABS) 100 MG tablet Take 1 tablet (100 mg total) by mouth 2 (two) times daily. 20 tablet 0   No facility-administered medications prior to visit.    Allergies  Allergen Reactions   Amoxicillin Hives and Other (See Comments)    Blisters    Penicillins Hives    ROS Review of Systems  Constitutional:  Negative for chills and fever.  HENT:  Negative for congestion and sore throat.   Eyes:  Negative for pain and discharge.  Respiratory:  Negative for cough and shortness of breath.   Cardiovascular:  Negative for chest pain and palpitations.  Gastrointestinal:  Negative for constipation, diarrhea, nausea and vomiting.  Endocrine: Negative for polydipsia and polyuria.  Genitourinary:  Negative for dysuria and hematuria.  Musculoskeletal:  Negative for neck pain and neck stiffness.  Skin:  Negative for rash.  Neurological:  Positive for numbness (and burning of feet). Negative for dizziness, weakness and headaches.  Psychiatric/Behavioral:  Negative for agitation and behavioral problems.       Objective:    Physical Exam Vitals reviewed.  Constitutional:      General: He is not in acute distress.    Appearance: He is not diaphoretic.  HENT:     Head: Normocephalic and atraumatic.  Eyes:     General: No scleral icterus.    Extraocular Movements: Extraocular movements intact.  Cardiovascular:     Rate and Rhythm: Normal rate and regular rhythm.     Heart sounds: Normal heart sounds. No murmur heard. Pulmonary:     Breath sounds: Normal breath sounds. No wheezing or rales.  Musculoskeletal:     Cervical back: Neck supple. No tenderness.     Right lower leg: No edema.     Left lower leg: No edema.  Skin:    General: Skin is warm.     Findings: No rash.  Neurological:     General: No focal deficit present.      Mental Status: He is alert and oriented to person, place, and time.  Psychiatric:        Mood and Affect: Mood normal.        Behavior: Behavior normal.     BP (!) 146/84 (BP Location: Right  Arm, Cuff Size: Normal)   Pulse 74   Resp 18   Ht 6' (1.829 m)   Wt 212 lb 12.8 oz (96.5 kg)   SpO2 96%   BMI 28.86 kg/m  Wt Readings from Last 3 Encounters:  10/08/22 212 lb 12.8 oz (96.5 kg)  06/23/22 216 lb (98 kg)  01/05/22 222 lb 0.6 oz (100.7 kg)    No results found for: "TSH" Lab Results  Component Value Date   WBC 4.0 10/05/2017   HGB 14.0 08/05/2022   HCT 38.0 (L) 10/05/2017   MCV 88.6 10/05/2017   PLT 145 (L) 10/05/2017   Lab Results  Component Value Date   NA 141 08/05/2022   K 4.0 10/05/2017   CO2 26 (A) 08/05/2022   GLUCOSE 107 (H) 10/05/2017   BUN 10 10/05/2017   CREATININE 1.6 (A) 08/05/2022   ALKPHOS 153 (A) 08/05/2022   AST 30 08/05/2022   ALT 38 08/05/2022   CALCIUM 9.7 08/05/2022   ANIONGAP 5 10/05/2017   EGFR 48 08/05/2022   No results found for: "CHOL" No results found for: "HDL" No results found for: "LDLCALC" No results found for: "TRIG" No results found for: "CHOLHDL" No results found for: "HGBA1C"    Assessment & Plan:   Problem List Items Addressed This Visit       Cardiovascular and Mediastinum   Essential hypertension - Primary    BP Readings from Last 1 Encounters:  10/08/22 (!) 146/84  New onset Advised to start taking amlodipine 5 mg daily, he prefers to discuss with [place] for compliance with the medications Advised DASH diet and moderate exercise/walking, at least 150 mins/week        Nervous and Auditory   Idiopathic progressive neuropathy    On gabapentin 100 mg 3 times daily        Other   HIV (human immunodeficiency virus infection) ([place])    On Genvoya      Chronic nonintractable headache    Takes Topamax as needed (?)  through [place]       No orders of the defined types were placed in this  encounter.   Follow-up: Return in about 3 months (around 01/08/2023).    Lindell Spar, MD

## 2022-10-08 NOTE — Assessment & Plan Note (Signed)
On gabapentin 100 mg 3 times daily

## 2022-10-08 NOTE — Assessment & Plan Note (Signed)
Takes Topamax as needed (?)  through [place]

## 2022-10-08 NOTE — Assessment & Plan Note (Signed)
BP Readings from Last 1 Encounters:  10/08/22 (!) 146/84   New onset Advised to start taking amlodipine 5 mg daily, he prefers to discuss with [place] for compliance with the medications Advised DASH diet and moderate exercise/walking, at least 150 mins/week

## 2022-10-26 ENCOUNTER — Telehealth: Payer: Self-pay | Admitting: Internal Medicine

## 2022-10-26 NOTE — Telephone Encounter (Signed)
Claim could not be process due to [place] should have went to [facility]  Looks like this was sent to [facility].    Can we resubmit to [facility] 10.26.2023

## 2022-10-30 ENCOUNTER — Encounter: Payer: Self-pay | Admitting: Internal Medicine

## 2022-12-24 ENCOUNTER — Encounter: Payer: Non-veteran care | Admitting: Nurse Practitioner

## 2022-12-24 ENCOUNTER — Encounter: Payer: Self-pay | Admitting: Internal Medicine

## 2022-12-24 ENCOUNTER — Ambulatory Visit (INDEPENDENT_AMBULATORY_CARE_PROVIDER_SITE_OTHER): Payer: Medicare Other | Admitting: Internal Medicine

## 2022-12-24 VITALS — BP 138/72 | HR 67 | Ht 72.0 in | Wt 211.6 lb

## 2022-12-24 DIAGNOSIS — R079 Chest pain, unspecified: Secondary | ICD-10-CM

## 2022-12-24 DIAGNOSIS — R519 Headache, unspecified: Secondary | ICD-10-CM

## 2022-12-24 DIAGNOSIS — R7303 Prediabetes: Secondary | ICD-10-CM | POA: Diagnosis not present

## 2022-12-24 DIAGNOSIS — Z0001 Encounter for general adult medical examination with abnormal findings: Secondary | ICD-10-CM | POA: Diagnosis not present

## 2022-12-24 DIAGNOSIS — I10 Essential (primary) hypertension: Secondary | ICD-10-CM | POA: Diagnosis not present

## 2022-12-24 DIAGNOSIS — N1831 Chronic kidney disease, stage 3a: Secondary | ICD-10-CM | POA: Diagnosis not present

## 2022-12-24 DIAGNOSIS — G603 Idiopathic progressive neuropathy: Secondary | ICD-10-CM

## 2022-12-24 DIAGNOSIS — G8929 Other chronic pain: Secondary | ICD-10-CM

## 2022-12-24 DIAGNOSIS — E782 Mixed hyperlipidemia: Secondary | ICD-10-CM

## 2022-12-24 DIAGNOSIS — B20 Human immunodeficiency virus [HIV] disease: Secondary | ICD-10-CM

## 2022-12-24 NOTE — Patient Instructions (Signed)
Please continue taking medications as prescribed.  Please continue to follow low salt diet and perform moderate exercise/walking as tolerated. 

## 2022-12-24 NOTE — Assessment & Plan Note (Addendum)
BP Readings from Last 1 Encounters:  12/24/22 138/72   Well controlled with diet and exercise Counseled for compliance with the medications Advised DASH diet and moderate exercise/walking, at least 150 mins/week

## 2022-12-25 DIAGNOSIS — Z0001 Encounter for general adult medical examination with abnormal findings: Secondary | ICD-10-CM | POA: Insufficient documentation

## 2022-12-25 DIAGNOSIS — R079 Chest pain, unspecified: Secondary | ICD-10-CM | POA: Insufficient documentation

## 2022-12-25 NOTE — Assessment & Plan Note (Signed)
On Genvoya

## 2022-12-25 NOTE — Assessment & Plan Note (Signed)
Last BMP from [place] records reviewed, GFR 48 Stable recently Avoid nephrotoxic agents Maintain adequate hydration

## 2022-12-25 NOTE — Progress Notes (Signed)
Established Patient Office Visit  Subjective:  Patient ID: Todd Cardenas, male    DOB: 20-Jun-1956  Age: 67 y.o. MRN: [id number]  CC:  Chief Complaint  Patient presents with   Annual Exam    Patient started having a dull pain in the left side of his chest during the evenings this started on 12/20. He is also having ringing in his ears that started 01/01.    HPI Todd Cardenas is a 67 y.o. male with past medical history of HTN, HIV, HLD and chronic headache who presents for annual physical.  He reports intermittent chest pain episodes in the evenings.  Pain is on the left side, dull, nonradiating and is not associated with activity.  He denies any dyspnea or palpitations with it.  His BP is well controlled at home.  He was placed on lisinopril by his Josephine, but he did not take it as his BP was WNL at home.  He also reports chronic ringing of ears bilaterally.  Denies any ear pain or discharge.  Denies any nasal congestion, postnasal drip or sore throat.  He is planning to see ENT specialist at [facility].  He has not had normal hearing tests.  CKD: Blood tests from [place] records reviewed. GFR has been around 48.  Denies any dysuria, hematuria, urinary hesitance or resistance.     Past Medical History:  Diagnosis Date   BPH (benign prostatic hyperplasia)    BPH (benign prostatic hyperplasia)    Closed fracture of foot 10/08/2022   Constipation    GERD (gastroesophageal reflux disease)    Goiter    HIV positive (HCC)    Vitamin D deficiency     History reviewed. No pertinent surgical history.  Family History  Problem Relation Age of Onset   Diabetes Mother    Arthritis Mother    Hypertension Mother    Hyperlipidemia Mother    Throat cancer Father    Alcohol abuse Father    Thyroid disease Sister    Hyperlipidemia Sister    Benign prostatic hyperplasia Brother    Lung cancer Maternal Aunt    Lung cancer Maternal Uncle    Lung cancer Paternal Aunt    Lung cancer Paternal  Uncle    Prostate cancer Neg Hx     Social History   Socioeconomic History   Marital status: Divorced    Spouse name: Not on file   Number of children: Not on file   Years of education: Not on file   Highest education level: Not on file  Occupational History   Not on file  Tobacco Use   Smoking status: Former    Packs/day: 1.00    Years: 30.00    Total pack years: 30.00    Types: Cigarettes    Quit date: 04/17/2020    Years since quitting: 2.6   Smokeless tobacco: Never   Tobacco comments:    He quit on 04/17/2020. He smoked 2 packs a day for 30 years.   Vaping Use   Vaping Use: Never used  Substance and Sexual Activity   Alcohol use: Yes    Comment: occ   Drug use: No   Sexual activity: Not Currently  Other Topics Concern   Not on file  Social History Narrative   Lives alone, retired   Investment banker, operational of Radio broadcast assistant Strain: Not on file  Food Insecurity: Not on file  Transportation Needs: Not on file  Physical Activity: Not on file  Stress: Not on file  Social Connections: Not on file  Intimate Partner Violence: Not on file    Outpatient Medications Prior to Visit  Medication Sig Dispense Refill   acetaminophen (TYLENOL) 325 MG tablet Take 650 mg by mouth every 6 (six) hours as needed. Take 2 tablets by mouth two times a day as needed for headaches     cetirizine (ZYRTEC) 10 MG tablet Take 10 mg by mouth daily.     cyclobenzaprine (FLEXERIL) 10 MG tablet Take 10 mg by mouth 3 (three) times daily as needed for muscle spasms. Take one tablet by mouth two times a day as needed     elvitegravir-cobicistat-emtricitabine-tenofovir (GENVOYA) 150-150-200-10 MG TABS tablet Take 1 tablet by mouth daily with breakfast.     gabapentin (NEURONTIN) 100 MG capsule Take 100 mg by mouth 3 (three) times daily. Take one capsule by mouth two times a day     gemfibrozil (LOPID) 600 MG tablet Take 600 mg by mouth 2 (two) times daily before a meal.     Multiple  Vitamin (MULTIVITAMIN WITH MINERALS) TABS tablet Take 1 tablet by mouth every morning.     polyethylene glycol (MIRALAX / GLYCOLAX) 17 g packet Take 17 g by mouth daily.     topiramate (TOPAMAX) 25 MG tablet Take 25 mg by mouth 2 (two) times daily. Take one tablet by mouth at bedtime     albuterol (PROVENTIL) (2.5 MG/3ML) 0.083% nebulizer solution 1 AMPULE BY ORAL INHALATION EVERY DAY AS NEEDED FOR COUGH AND SHORTNESS OF BREATH (Patient not taking: Reported on 12/24/2022)     ipratropium (ATROVENT) 0.02 % nebulizer solution 1 NEB INHALATION EVERY DAY AS NEEDED FOR COUGH AND SHORTNESS OF BREATH (Patient not taking: Reported on 12/24/2022)     polycarbophil (FIBERCON) 625 MG tablet Take 625 mg by mouth daily.     senna-docusate (SENOKOT-S) 8.6-50 MG tablet Take 2 tablets by mouth daily.     vitamin E 180 MG (400 UNITS) capsule Take 400 Units by mouth daily. (Patient not taking: Reported on 12/24/2022)     lisinopril (ZESTRIL) 5 MG tablet Take 1 tablet by mouth daily. (Patient not taking: Reported on 12/24/2022)     No facility-administered medications prior to visit.    Allergies  Allergen Reactions   Amoxicillin Hives and Other (See Comments)    Blisters    Penicillins Hives    ROS Review of Systems  Constitutional:  Negative for chills and fever.  HENT:  Negative for congestion and sore throat.   Eyes:  Negative for pain and discharge.  Respiratory:  Negative for cough and shortness of breath.   Cardiovascular:  Negative for chest pain and palpitations.  Gastrointestinal:  Negative for constipation, diarrhea, nausea and vomiting.  Endocrine: Negative for polydipsia and polyuria.  Genitourinary:  Negative for dysuria and hematuria.  Musculoskeletal:  Negative for neck pain and neck stiffness.  Skin:  Negative for rash.  Neurological:  Positive for numbness (and burning of feet). Negative for dizziness, weakness and headaches.  Psychiatric/Behavioral:  Negative for agitation and  behavioral problems.       Objective:    Physical Exam Vitals reviewed.  Constitutional:      General: He is not in acute distress.    Appearance: He is not diaphoretic.  HENT:     Head: Normocephalic and atraumatic.  Eyes:     General: No scleral icterus.    Extraocular Movements: Extraocular movements intact.  Cardiovascular:     Rate and Rhythm:  Normal rate and regular rhythm.     Heart sounds: Normal heart sounds. No murmur heard. Pulmonary:     Breath sounds: Normal breath sounds. No wheezing or rales.  Musculoskeletal:     Cervical back: Neck supple. No tenderness.     Right lower leg: No edema.     Left lower leg: No edema.  Skin:    General: Skin is warm.     Findings: No rash.  Neurological:     General: No focal deficit present.     Mental Status: He is alert and oriented to person, place, and time.     Cranial Nerves: No cranial nerve deficit.     Sensory: No sensory deficit.     Motor: No weakness.  Psychiatric:        Mood and Affect: Mood normal.        Behavior: Behavior normal.     BP 138/72 (BP Location: Left Arm, Cuff Size: Normal)   Pulse 67   Ht 6' (1.829 m)   Wt 211 lb 9.6 oz (96 kg)   SpO2 97%   BMI 28.70 kg/m  Wt Readings from Last 3 Encounters:  12/24/22 211 lb 9.6 oz (96 kg)  10/08/22 212 lb 12.8 oz (96.5 kg)  06/23/22 216 lb (98 kg)    No results found for: "TSH" Lab Results  Component Value Date   WBC 4.0 10/05/2017   HGB 14.0 08/05/2022   HCT 38.0 (L) 10/05/2017   MCV 88.6 10/05/2017   PLT 145 (L) 10/05/2017   Lab Results  Component Value Date   NA 141 08/05/2022   K 4.0 10/05/2017   CO2 26 (A) 08/05/2022   GLUCOSE 107 (H) 10/05/2017   BUN 10 10/05/2017   CREATININE 1.6 (A) 08/05/2022   ALKPHOS 153 (A) 08/05/2022   AST 30 08/05/2022   ALT 38 08/05/2022   CALCIUM 9.7 08/05/2022   ANIONGAP 5 10/05/2017   EGFR 48 08/05/2022   No results found for: "CHOL" No results found for: "HDL" No results found for:  "LDLCALC" No results found for: "TRIG" No results found for: "CHOLHDL" No results found for: "HGBA1C"    Assessment & Plan:   Problem List Items Addressed This Visit       Cardiovascular and Mediastinum   Essential hypertension    BP Readings from Last 1 Encounters:  12/24/22 138/72  Well controlled with diet and exercise Counseled for compliance with the medications Advised DASH diet and moderate exercise/walking, at least 150 mins/week        Nervous and Auditory   Idiopathic progressive neuropathy    On gabapentin 100 mg 3 times daily        Genitourinary   Stage 3a chronic kidney disease ([place])    Last BMP from [place] records reviewed, GFR 48 Stable recently Avoid nephrotoxic agents Maintain adequate hydration        Other   HIV (human immunodeficiency virus infection) ([place])    On Genvoya      Prediabetes    Advised to follow DASH diet for now      Chronic nonintractable headache    Takes Topamax as needed (?)  through [place] for general adult medical examination with abnormal findings - Primary    Physical exam as documented. Counseling done  re healthy lifestyle involving commitment to 150 minutes exercise per week, heart healthy diet, and attaining healthy weight.The importance of adequate sleep also discussed. Changes in health  habits are decided on by the patient with goals and time frames  set for achieving them. Immunization and cancer screening needs are specifically addressed at this visit.      Chest pain    Unclear etiology, she has left-sided chest pain, which is dull/pressure-like, intermittent and unrelated to activity Considering his history of smoking and HTN, have to rule out cardiac etiology He prefers to see cardiology through [place] - may need stress test EKG: Sinus rhythm.  No signs of active ischemia.      Relevant Orders   EKG 12-Lead (Completed)    No orders of the defined types were placed in this  encounter.   Follow-up: Return in about 6 months (around 06/24/2023).    Lindell Spar, MD

## 2022-12-25 NOTE — Assessment & Plan Note (Signed)
On gabapentin 100 mg 3 times daily

## 2022-12-25 NOTE — Assessment & Plan Note (Signed)
Unclear etiology, she has left-sided chest pain, which is dull/pressure-like, intermittent and unrelated to activity Considering his history of smoking and HTN, have to rule out cardiac etiology He prefers to see cardiology through [place] - may need stress test EKG: Sinus rhythm.  No signs of active ischemia.

## 2022-12-25 NOTE — Assessment & Plan Note (Signed)

## 2022-12-25 NOTE — Assessment & Plan Note (Signed)
Advised to follow DASH diet for now 

## 2022-12-25 NOTE — Assessment & Plan Note (Signed)
Takes Topamax as needed (?)  through [place]

## 2022-12-28 ENCOUNTER — Other Ambulatory Visit: Payer: Self-pay | Admitting: Internal Medicine

## 2023-01-07 ENCOUNTER — Encounter: Payer: Self-pay | Admitting: Internal Medicine

## 2023-02-11 ENCOUNTER — Encounter: Payer: Self-pay | Admitting: Internal Medicine

## 2023-03-27 ENCOUNTER — Encounter: Payer: Self-pay | Admitting: Internal Medicine

## 2023-04-03 ENCOUNTER — Encounter: Payer: Self-pay | Admitting: Internal Medicine

## 2023-06-25 ENCOUNTER — Encounter: Payer: Self-pay | Admitting: Internal Medicine

## 2023-06-25 ENCOUNTER — Ambulatory Visit (INDEPENDENT_AMBULATORY_CARE_PROVIDER_SITE_OTHER): Payer: Medicare Other | Admitting: Internal Medicine

## 2023-06-25 ENCOUNTER — Ambulatory Visit: Payer: Non-veteran care | Admitting: Family Medicine

## 2023-06-25 VITALS — BP 136/80 | HR 84 | Ht 78.0 in | Wt 217.0 lb

## 2023-06-25 DIAGNOSIS — K219 Gastro-esophageal reflux disease without esophagitis: Secondary | ICD-10-CM

## 2023-06-25 DIAGNOSIS — N401 Enlarged prostate with lower urinary tract symptoms: Secondary | ICD-10-CM

## 2023-06-25 DIAGNOSIS — B20 Human immunodeficiency virus [HIV] disease: Secondary | ICD-10-CM | POA: Diagnosis not present

## 2023-06-25 DIAGNOSIS — I10 Essential (primary) hypertension: Secondary | ICD-10-CM | POA: Diagnosis not present

## 2023-06-25 MED ORDER — FAMOTIDINE 40 MG PO TABS: 40.0000 mg | ORAL_TABLET | Freq: Every day | ORAL | 3 refills | Status: DC

## 2023-06-25 NOTE — Progress Notes (Signed)
Established Patient Office Visit  Subjective:  Patient ID: Todd Cardenas, male    DOB: [birth year]  Age: 67 y.o. MRN: [id number]  CC:  Chief Complaint  Patient presents with   Hypertension    Follow up    HPI Todd Cardenas is a 67 y.o. male with past medical history of HTN, HIV, HLD and chronic headache who presents for f/u of her chronic medical conditions.  His BP is well-controlled today. His BP was 127/63 at home today. Does not have chest pain, dyspnea or palpitations.  He had allergic reaction to lisinopril in 05/24. He had allergic reaction to food - likely fish, was given Prednisone in 06/24, completed treatment.  He reports acid reflux symptoms for the last 2 weeks. No epigastric pain, nausea or vomiting. No dysphagia or odynophagia.  BPH: On Flomax now. No dysuria or hematuria.  Past Medical History:  Diagnosis Date   BPH (benign prostatic hyperplasia)    BPH (benign prostatic hyperplasia)    Closed fracture of foot 10/08/2022   Constipation    GERD (gastroesophageal reflux disease)    Goiter    HIV positive (HCC)    Vitamin D deficiency     History reviewed. No pertinent surgical history.  Family History  Problem Relation Age of Onset   Diabetes Mother    Arthritis Mother    Hypertension Mother    Hyperlipidemia Mother    Throat cancer Father    Alcohol abuse Father    Thyroid disease Sister    Hyperlipidemia Sister    Benign prostatic hyperplasia Brother    Lung cancer Maternal Aunt    Lung cancer Maternal Uncle    Lung cancer Paternal Aunt    Lung cancer Paternal Uncle    Prostate cancer Neg Hx     Social History   Socioeconomic History   Marital status: Divorced    Spouse name: Not on file   Number of children: Not on file   Years of education: Not on file   Highest education level: GED or equivalent  Occupational History   Not on file  Tobacco Use   Smoking status: Former    Current packs/day: 0.00    Average packs/day: 1 pack/day  for 30.0 years (30.0 ttl pk-yrs)    Types: Cigarettes    Start date: 04/17/1990    Quit date: 04/17/2020    Years since quitting: 3.1   Smokeless tobacco: Never   Tobacco comments:    He quit on 04/17/2020. He smoked 2 packs a day for 30 years.   Vaping Use   Vaping status: Never Used  Substance and Sexual Activity   Alcohol use: Yes    Comment: occ   Drug use: No   Sexual activity: Not Currently  Other Topics Concern   Not on file  Social History Narrative   Lives alone, retired   International aid/development worker of Health   Financial Resource Strain: Low Risk  (06/21/2023)   Overall Financial Resource Strain (CARDIA)    Difficulty of Paying Living Expenses: Not hard at all  Food Insecurity: No Food Insecurity (06/21/2023)   Hunger Vital Sign    Worried About Running Out of Food in the Last Year: Never true    Ran Out of Food in the Last Year: Never true  Transportation Needs: No Transportation Needs (06/21/2023)   PRAPARE - Administrator, Civil Service (Medical): No    Lack of Transportation (Non-Medical): No  Physical Activity: Sufficiently Active (06/21/2023)  Exercise Vital Sign    Days of Exercise per Week: 6 days    Minutes of Exercise per Session: 70 min  Stress: No Stress Concern Present (06/21/2023)   [employer] of Occupational Health - Occupational Stress Questionnaire    Feeling of Stress : Only a little  Social Connections: Moderately Isolated (06/21/2023)   Social Connection and Isolation Panel [NHANES]    Frequency of Communication with Friends and Family: More than three times a week    Frequency of Social Gatherings with Friends and Family: More than three times a week    Attends Religious Services: More than 4 times per year    Active Member of [employer] or Organizations: No    Attends Banker Meetings: Not on file    Marital Status: Widowed  Intimate Partner Violence: Not on file    Outpatient Medications Prior to Visit  Medication Sig Dispense  Refill   acetaminophen (TYLENOL) 325 MG tablet Take 650 mg by mouth every 6 (six) hours as needed. Take 2 tablets by mouth two times a day as needed for headaches     bictegravir-emtricitabine-tenofovir AF (BIKTARVY) 50-200-25 MG TABS tablet Take 1 tablet by mouth daily.     cetirizine (ZYRTEC) 10 MG tablet Take 10 mg by mouth daily.     cyclobenzaprine (FLEXERIL) 10 MG tablet Take 10 mg by mouth 3 (three) times daily as needed for muscle spasms. Take one tablet by mouth two times a day as needed     gabapentin (NEURONTIN) 100 MG capsule Take 100 mg by mouth 3 (three) times daily. Take one capsule by mouth two times a day     gemfibrozil (LOPID) 600 MG tablet Take 600 mg by mouth 2 (two) times daily before a meal.     Multiple Vitamin (MULTIVITAMIN WITH MINERALS) TABS tablet Take 1 tablet by mouth every morning.     polycarbophil (FIBERCON) 625 MG tablet Take 625 mg by mouth daily.     polyethylene glycol (MIRALAX / GLYCOLAX) 17 g packet Take 17 g by mouth daily.     senna-docusate (SENOKOT-S) 8.6-50 MG tablet Take 2 tablets by mouth daily.     tamsulosin (FLOMAX) 0.4 MG CAPS capsule Take 0.4 mg by mouth daily.     praziquantel (BILTRICIDE) 600 MG tablet Take 600 mg by mouth 3 (three) times daily.     topiramate (TOPAMAX) 25 MG tablet Take 25 mg by mouth 2 (two) times daily. Take one tablet by mouth at bedtime     elvitegravir-cobicistat-emtricitabine-tenofovir (GENVOYA) 150-150-200-10 MG TABS tablet Take 1 tablet by mouth daily with breakfast.     No facility-administered medications prior to visit.    Allergies  Allergen Reactions   Amoxicillin Hives and Other (See Comments)    Blisters    Lisinopril Swelling   Penicillins Hives    ROS Review of Systems  Constitutional:  Negative for chills and fever.  HENT:  Negative for congestion and sore throat.   Eyes:  Negative for pain and discharge.  Respiratory:  Negative for cough and shortness of breath.   Cardiovascular:  Negative for  chest pain and palpitations.  Gastrointestinal:  Negative for diarrhea, nausea and vomiting.  Endocrine: Negative for polydipsia and polyuria.  Genitourinary:  Negative for dysuria and hematuria.  Musculoskeletal:  Negative for neck pain and neck stiffness.  Skin:  Negative for rash.  Neurological:  Negative for dizziness, weakness and headaches.  Psychiatric/Behavioral:  Negative for agitation and behavioral problems.  Objective:    Physical Exam Vitals reviewed.  Constitutional:      General: He is not in acute distress.    Appearance: He is not diaphoretic.  HENT:     Head: Normocephalic and atraumatic.  Eyes:     General: No scleral icterus.    Extraocular Movements: Extraocular movements intact.  Cardiovascular:     Rate and Rhythm: Normal rate and regular rhythm.     Heart sounds: Normal heart sounds. No murmur heard. Pulmonary:     Breath sounds: Normal breath sounds. No wheezing or rales.  Musculoskeletal:     Cervical back: Neck supple. No tenderness.     Right lower leg: No edema.     Left lower leg: No edema.  Skin:    General: Skin is warm.     Findings: No rash.  Neurological:     General: No focal deficit present.     Mental Status: He is alert and oriented to person, place, and time.  Psychiatric:        Mood and Affect: Mood normal.        Behavior: Behavior normal.     BP 136/80 (BP Location: Left Arm, Cuff Size: Normal)   Pulse 84   Ht 6\' 6"  (1.981 m)   Wt 217 lb (98.4 kg)   SpO2 95%   BMI 25.08 kg/m  Wt Readings from Last 3 Encounters:  06/25/23 217 lb (98.4 kg)  12/24/22 211 lb 9.6 oz (96 kg)  10/08/22 212 lb 12.8 oz (96.5 kg)    No results found for: "TSH" Lab Results  Component Value Date   WBC 4.0 10/05/2017   HGB 14.0 08/05/2022   HCT 38.0 (L) 10/05/2017   MCV 88.6 10/05/2017   PLT 145 (L) 10/05/2017   Lab Results  Component Value Date   NA 141 08/05/2022   K 4.0 10/05/2017   CO2 26 (A) 08/05/2022   GLUCOSE 107 (H)  10/05/2017   BUN 10 10/05/2017   CREATININE 1.6 (A) 08/05/2022   ALKPHOS 153 (A) 08/05/2022   AST 30 08/05/2022   ALT 38 08/05/2022   CALCIUM 9.7 08/05/2022   ANIONGAP 5 10/05/2017   EGFR 48 08/05/2022   No results found for: "CHOL" No results found for: "HDL" No results found for: "LDLCALC" No results found for: "TRIG" No results found for: "CHOLHDL" No results found for: "HGBA1C"    Assessment & Plan:   Problem List Items Addressed This Visit       Cardiovascular and Mediastinum   Essential hypertension    BP Readings from Last 1 Encounters:  06/25/23 136/80   Well controlled with diet and exercise Had angioedema with lisinopril Advised DASH diet and moderate exercise/walking as tolerated        Digestive   GERD (gastroesophageal reflux disease)    Started Pepcid for acid reflux Avoid hot and spicy food Was recently given oral prednisone, which could have caused mild gastritis although he was given Pepcid with it from [place]      Relevant Medications   famotidine (PEPCID) 40 MG tablet     Genitourinary   BPH (benign prostatic hyperplasia) - Primary    Takes Tamsulosin 0.4 mg QD      Relevant Medications   tamsulosin (FLOMAX) 0.4 MG CAPS capsule     Other   HIV (human immunodeficiency virus infection) (HCC)    [facility]      Relevant Medications   bictegravir-emtricitabine-tenofovir AF (  BIKTARVY) 50-200-25 MG TABS tablet     Meds ordered this encounter  Medications   famotidine (PEPCID) 40 MG tablet    Sig: Take 1 tablet (40 mg total) by mouth daily.    Dispense:  30 tablet    Refill:  3    Follow-up: Return in about 6 months (around 12/26/2023) for GERD.    Anabel Halon, MD

## 2023-06-25 NOTE — Patient Instructions (Addendum)
Please consider using Systane eye drops for dry eyes.  Please start taking Pepcid for acid reflux.  Please continue to take medications as prescribed.  Please continue to follow low salt diet and ambulate as tolerated.

## 2023-06-25 NOTE — Assessment & Plan Note (Signed)
Takes Tamsulosin 0.4 mg QD

## 2023-06-25 NOTE — Assessment & Plan Note (Addendum)
On Biktarvy now Followed by [facility] clinic

## 2023-06-25 NOTE — Assessment & Plan Note (Addendum)
Started Pepcid for acid reflux Avoid hot and spicy food Was recently given oral prednisone, which could have caused mild gastritis although he was given Pepcid with it from [place]

## 2023-06-25 NOTE — Assessment & Plan Note (Addendum)
BP Readings from Last 1 Encounters:  06/25/23 136/80   Well controlled with diet and exercise Had angioedema with lisinopril Advised DASH diet and moderate exercise/walking as tolerated

## 2023-06-28 ENCOUNTER — Encounter: Payer: Self-pay | Admitting: Internal Medicine

## 2023-06-28 LAB — HM COLONOSCOPY

## 2023-07-05 ENCOUNTER — Encounter: Payer: Self-pay | Admitting: Internal Medicine

## 2023-07-07 ENCOUNTER — Encounter: Payer: Self-pay | Admitting: Internal Medicine

## 2023-08-30 ENCOUNTER — Encounter: Payer: Self-pay | Admitting: Internal Medicine

## 2023-12-20 LAB — HEMOGLOBIN A1C: Hemoglobin A1C: 6.2

## 2023-12-27 ENCOUNTER — Encounter: Payer: Self-pay | Admitting: Internal Medicine

## 2023-12-27 ENCOUNTER — Ambulatory Visit (INDEPENDENT_AMBULATORY_CARE_PROVIDER_SITE_OTHER): Payer: Medicare Other | Admitting: Internal Medicine

## 2023-12-27 VITALS — BP 136/80 | HR 67 | Ht 78.0 in | Wt 217.4 lb

## 2023-12-27 DIAGNOSIS — R7303 Prediabetes: Secondary | ICD-10-CM | POA: Diagnosis not present

## 2023-12-27 DIAGNOSIS — N401 Enlarged prostate with lower urinary tract symptoms: Secondary | ICD-10-CM | POA: Diagnosis not present

## 2023-12-27 DIAGNOSIS — B20 Human immunodeficiency virus [HIV] disease: Secondary | ICD-10-CM

## 2023-12-27 DIAGNOSIS — Z0001 Encounter for general adult medical examination with abnormal findings: Secondary | ICD-10-CM | POA: Diagnosis not present

## 2023-12-27 DIAGNOSIS — N1831 Chronic kidney disease, stage 3a: Secondary | ICD-10-CM

## 2023-12-27 DIAGNOSIS — K921 Melena: Secondary | ICD-10-CM

## 2023-12-27 DIAGNOSIS — K219 Gastro-esophageal reflux disease without esophagitis: Secondary | ICD-10-CM | POA: Diagnosis not present

## 2023-12-27 NOTE — Assessment & Plan Note (Addendum)
 Last BMP from [place] records reviewed, GFR 55 (12/24) Stable recently Avoid nephrotoxic agents Maintain adequate hydration

## 2023-12-27 NOTE — Assessment & Plan Note (Signed)
 Improved with Pepcid Avoid hot and spicy food

## 2023-12-27 NOTE — Assessment & Plan Note (Addendum)
 Physical exam as documented. Fasting blood tests at [facility] clinic. [facility] the [facility].

## 2023-12-27 NOTE — Assessment & Plan Note (Signed)
 Intermittent Has had GI evaluation at [facility], was benign overall Advised to take Aspirin 81 mg every other day instead of once daily CBC showed Hb of 14 (12/24) - stable

## 2023-12-27 NOTE — Assessment & Plan Note (Signed)
On Biktarvy now Followed by [facility] clinic

## 2023-12-27 NOTE — Progress Notes (Addendum)
 Established Patient Office Visit  Subjective:  Patient ID: Todd Cardenas, male    DOB: Apr 14, 1956  Age: 68 y.o. MRN: [id number]  CC:  Chief Complaint  Patient presents with   Care Management    6 month f/u, states he had a home nurse visit on 12/20/23 his alc was 6.2, wants to discuss.    Gastroesophageal Reflux    HPI Todd Cardenas is a 68 y.o. male with past medical history of HTN, HIV, HLD and chronic headache who presents for annual physical.  His BP is well-controlled today. His BP was 128/65 at home today. Does not have chest pain, dyspnea or palpitations.  He had allergic reaction to lisinopril in 05/24. He had allergic reaction to food - likely fish, was given Prednisone in 06/24, completed treatment.   He used to have acid reflux symptoms on intermittent basis, which has improved with Pepcid  now. No epigastric pain, nausea or vomiting. No dysphagia or odynophagia.   BPH: On Flomax now. No dysuria or hematuria.  CKD: Blood tests from [place] records reviewed. S. Cr. was 1.4. GFR was 55 in 12/24.  Denies any dysuria, hematuria, urinary hesitance or resistance.  He has intermittent blood in stool, 1 or 2 times in a month. He has had GI evaluation at [facility], which was benign. He takes Aspirin 81 mg once daily for history of ?TIA (advised from [place]).     Past Medical History:  Diagnosis Date   BPH (benign prostatic hyperplasia)    BPH (benign prostatic hyperplasia)    Closed fracture of foot 10/08/2022   Constipation    GERD (gastroesophageal reflux disease)    Goiter    HIV positive (HCC)    Vitamin D deficiency     History reviewed. No pertinent surgical history.  Family History  Problem Relation Age of Onset   Diabetes Mother    Arthritis Mother    Hypertension Mother    Hyperlipidemia Mother    Throat cancer Father    Alcohol abuse Father    Thyroid disease Sister    Hyperlipidemia Sister    Benign prostatic hyperplasia Brother    Lung cancer Maternal Aunt     Lung cancer Maternal Uncle    Lung cancer Paternal Aunt    Lung cancer Paternal Uncle    Prostate cancer Neg Hx     Social History   Socioeconomic History   Marital status: Divorced    Spouse name: Not on file   Number of children: Not on file   Years of education: Not on file   Highest education level: GED or equivalent  Occupational History   Not on file  Tobacco Use   Smoking status: Former    Current packs/day: 0.00    Average packs/day: 1 pack/day for 30.0 years (30.0 ttl pk-yrs)    Types: Cigarettes    Start date: 04/17/1990    Quit date: 04/17/2020    Years since quitting: 3.6   Smokeless tobacco: Never   Tobacco comments:    He quit on 04/17/2020. He smoked 2 packs a day for 30 years.   Vaping Use   Vaping status: Never Used  Substance and Sexual Activity   Alcohol use: Yes    Comment: occ   Drug use: No   Sexual activity: Not Currently  Other Topics Concern   Not on file  Social History Narrative   Lives alone, retired   Social Drivers of [employer] Strain: Low Risk  (06/21/2023)  Overall Financial Resource Strain (CARDIA)    Difficulty of Paying Living Expenses: Not hard at all  Food Insecurity: No Food Insecurity (06/21/2023)   Hunger Vital Sign    Worried About Running Out of Food in the Last Year: Never true    Ran Out of Food in the Last Year: Never true  Transportation Needs: No Transportation Needs (06/21/2023)   PRAPARE - Administrator, Civil Service (Medical): No    Lack of Transportation (Non-Medical): No  Physical Activity: Sufficiently Active (06/21/2023)   Exercise Vital Sign    Days of Exercise per Week: 6 days    Minutes of Exercise per Session: 70 min  Stress: No Stress Concern Present (06/21/2023)   [employer] of Occupational Health - Occupational Stress Questionnaire    Feeling of Stress : Only a little  Social Connections: Moderately Isolated (06/21/2023)   Social Connection and Isolation Panel [NHANES]     Frequency of Communication with Friends and Family: More than three times a week    Frequency of Social Gatherings with Friends and Family: More than three times a week    Attends Religious Services: More than 4 times per year    Active Member of [employer] or Organizations: No    Attends Banker Meetings: Not on file    Marital Status: Widowed  Intimate Partner Violence: Not on file    Outpatient Medications Prior to Visit  Medication Sig Dispense Refill   acetaminophen (TYLENOL) 325 MG tablet Take 650 mg by mouth every 6 (six) hours as needed. Take 2 tablets by mouth two times a day as needed for headaches     bictegravir-emtricitabine-tenofovir AF (BIKTARVY) 50-200-25 MG TABS tablet Take 1 tablet by mouth daily.     cetirizine (ZYRTEC) 10 MG tablet Take 10 mg by mouth daily.     cyclobenzaprine (FLEXERIL) 10 MG tablet Take 10 mg by mouth 3 (three) times daily as needed for muscle spasms. Take one tablet by mouth two times a day as needed     famotidine  (PEPCID ) 40 MG tablet Take 1 tablet (40 mg total) by mouth daily. 30 tablet 3   gabapentin (NEURONTIN) 100 MG capsule Take 100 mg by mouth 3 (three) times daily. Take one capsule by mouth two times a day     gemfibrozil (LOPID) 600 MG tablet Take 600 mg by mouth 2 (two) times daily before a meal.     Multiple Vitamin (MULTIVITAMIN WITH MINERALS) TABS tablet Take 1 tablet by mouth every morning.     polycarbophil (FIBERCON) 625 MG tablet Take 625 mg by mouth daily.     polyethylene glycol (MIRALAX / GLYCOLAX) 17 g packet Take 17 g by mouth daily.     senna-docusate (SENOKOT-S) 8.6-50 MG tablet Take 2 tablets by mouth daily.     tamsulosin (FLOMAX) 0.4 MG CAPS capsule Take 0.4 mg by mouth daily.     No facility-administered medications prior to visit.    Allergies  Allergen Reactions   Amoxicillin Hives and Other (See Comments)    Blisters    Lisinopril Swelling   Penicillins Hives    ROS Review of Systems   Constitutional:  Negative for chills and fever.  HENT:  Negative for congestion and sore throat.   Eyes:  Negative for pain and discharge.  Respiratory:  Negative for cough and shortness of breath.   Cardiovascular:  Negative for chest pain and palpitations.  Gastrointestinal:  Negative for constipation, diarrhea, nausea and vomiting.  Endocrine: Negative for polydipsia and polyuria.  Genitourinary:  Negative for dysuria and hematuria.  Musculoskeletal:  Negative for neck pain and neck stiffness.  Skin:  Negative for rash.  Neurological:  Positive for numbness (and burning of feet). Negative for dizziness, weakness and headaches.  Psychiatric/Behavioral:  Negative for agitation and behavioral problems.       Objective:    Physical Exam Vitals reviewed.  Constitutional:      General: He is not in acute distress.    Appearance: He is not diaphoretic.  HENT:     Head: Normocephalic and atraumatic.  Eyes:     General: No scleral icterus.    Extraocular Movements: Extraocular movements intact.  Cardiovascular:     Rate and Rhythm: Normal rate and regular rhythm.     Heart sounds: Normal heart sounds. No murmur heard. Pulmonary:     Breath sounds: Normal breath sounds. No wheezing or rales.  Abdominal:     Palpations: Abdomen is soft.     Tenderness: There is no abdominal tenderness.  Musculoskeletal:     Cervical back: Neck supple. No tenderness.     Right lower leg: No edema.     Left lower leg: No edema.  Skin:    General: Skin is warm.     Findings: No rash.  Neurological:     General: No focal deficit present.     Mental Status: He is alert and oriented to person, place, and time.     Cranial Nerves: No cranial nerve deficit.     Sensory: No sensory deficit.     Motor: No weakness.  Psychiatric:        Mood and Affect: Mood normal.        Behavior: Behavior normal.     BP 136/80 (BP Location: Left Arm)   Pulse 67   Ht 6' 6 (1.981 m)   Wt 217 lb 6.4 oz (98.6  kg)   SpO2 98%   BMI 25.12 kg/m  Wt Readings from Last 3 Encounters:  12/27/23 217 lb 6.4 oz (98.6 kg)  06/25/23 217 lb (98.4 kg)  12/24/22 211 lb 9.6 oz (96 kg)    No results found for: TSH Lab Results  Component Value Date   WBC 4.0 10/05/2017   HGB 14.0 08/05/2022   HCT 38.0 (L) 10/05/2017   MCV 88.6 10/05/2017   PLT 145 (L) 10/05/2017   Lab Results  Component Value Date   NA 141 08/05/2022   K 4.0 10/05/2017   CO2 26 (A) 08/05/2022   GLUCOSE 107 (H) 10/05/2017   BUN 10 10/05/2017   CREATININE 1.6 (A) 08/05/2022   ALKPHOS 153 (A) 08/05/2022   AST 30 08/05/2022   ALT 38 08/05/2022   CALCIUM 9.7 08/05/2022   ANIONGAP 5 10/05/2017   EGFR 48 08/05/2022   No results found for: CHOL No results found for: HDL No results found for: LDLCALC No results found for: TRIG No results found for: CHOLHDL Lab Results  Component Value Date   HGBA1C 6.2 12/20/2023      Assessment & Plan:   Problem List Items Addressed This Visit       Digestive   GERD (gastroesophageal reflux disease)   Improved with Pepcid  Avoid hot and spicy food      Melena   Intermittent Has had GI evaluation at [facility], was benign overall Advised to take Aspirin 81 mg every other day instead of once daily CBC showed Hb of 14 (12/24) - stable  Genitourinary   BPH (benign prostatic hyperplasia)   Takes Tamsulosin 0.4 mg QD      Stage 3a chronic kidney disease (HCC)   Last BMP from [place] records reviewed, GFR 55 (12/24) Stable recently Avoid nephrotoxic agents Maintain adequate hydration        Other   HIV (human immunodeficiency virus infection) (HCC)   [facility] Biktarvy [facility]      Prediabetes   HbA1c: 6.2 (01/25) - home nurse visit Advised to follow DASH diet for now      Encounter for general adult medical examination with abnormal findings - Primary   Physical exam as documented. Fasting blood tests at [facility] clinic. [facility] according to the patient.       No orders of the defined types were placed in this encounter.   Follow-up: Return in about 6 months (around 06/25/2024).    Suzzane MARLA Blanch, MD

## 2023-12-27 NOTE — Assessment & Plan Note (Signed)
Takes Tamsulosin 0.4 mg QD

## 2023-12-27 NOTE — Patient Instructions (Signed)
Please continue to take medications as prescribed.  Please continue to follow low salt diet and perform moderate exercise/walking at least 150 mins/week. 

## 2023-12-27 NOTE — Assessment & Plan Note (Addendum)
 HbA1c: 6.2 (01/25) - home nurse visit Advised to follow DASH diet for now

## 2023-12-31 LAB — LIPID PANEL
Cholesterol: 129 (ref 0–200)
HDL: 38 (ref 35–70)
LDL Cholesterol: 65
Triglycerides: 123 (ref 40–160)

## 2024-01-13 ENCOUNTER — Encounter: Payer: Self-pay | Admitting: Internal Medicine

## 2024-01-18 ENCOUNTER — Encounter: Payer: Self-pay | Admitting: Internal Medicine

## 2024-02-15 ENCOUNTER — Encounter: Payer: Self-pay | Admitting: Internal Medicine

## 2024-04-15 ENCOUNTER — Encounter: Payer: Self-pay | Admitting: Internal Medicine

## 2024-04-19 LAB — CBC AND DIFFERENTIAL
Hemoglobin: 14.4 (ref 13.5–17.5)
Platelets: 243 K/uL (ref 150–400)
WBC: 4.3

## 2024-04-19 LAB — COMPREHENSIVE METABOLIC PANEL WITH GFR
HM HIV Screening: POSITIVE
eGFR: 47

## 2024-04-19 LAB — BASIC METABOLIC PANEL WITH GFR: Creatinine: 1.6 — AB (ref 0.6–1.3)

## 2024-05-13 ENCOUNTER — Encounter: Payer: Self-pay | Admitting: Internal Medicine

## 2024-06-02 ENCOUNTER — Encounter: Payer: Self-pay | Admitting: Internal Medicine

## 2024-06-19 ENCOUNTER — Encounter: Payer: Self-pay | Admitting: Internal Medicine

## 2024-06-26 ENCOUNTER — Ambulatory Visit (INDEPENDENT_AMBULATORY_CARE_PROVIDER_SITE_OTHER): Payer: Non-veteran care | Admitting: Internal Medicine

## 2024-06-26 VITALS — BP 130/86 | HR 65 | Ht 78.0 in | Wt 219.8 lb

## 2024-06-26 DIAGNOSIS — G603 Idiopathic progressive neuropathy: Secondary | ICD-10-CM | POA: Diagnosis not present

## 2024-06-26 DIAGNOSIS — K219 Gastro-esophageal reflux disease without esophagitis: Secondary | ICD-10-CM | POA: Diagnosis not present

## 2024-06-26 DIAGNOSIS — N401 Enlarged prostate with lower urinary tract symptoms: Secondary | ICD-10-CM | POA: Diagnosis not present

## 2024-06-26 DIAGNOSIS — N1831 Chronic kidney disease, stage 3a: Secondary | ICD-10-CM

## 2024-06-26 MED ORDER — FAMOTIDINE 40 MG PO TABS: 40.0000 mg | ORAL_TABLET | Freq: Every day | ORAL | 3 refills | Status: AC

## 2024-06-26 NOTE — Assessment & Plan Note (Signed)
On gabapentin 100 mg 3 times daily 

## 2024-06-26 NOTE — Assessment & Plan Note (Signed)
Takes Tamsulosin 0.4 mg QD

## 2024-06-26 NOTE — Assessment & Plan Note (Addendum)
 Improved with Pepcid , refilled Avoid hot and spicy food

## 2024-06-26 NOTE — Patient Instructions (Signed)
 Please continue to take medications as prescribed.  Please continue to follow low salt diet and perform moderate exercise/walking as tolerated.  Please maintain at least 64 ounces of fluid intake in a day.

## 2024-06-26 NOTE — Progress Notes (Unsigned)
 Established Patient Office Visit  Subjective:  Patient ID: Todd Cardenas, male    DOB: September 09, 1956  Age: 68 y.o. MRN: [id number]  CC:  Chief Complaint  Patient presents with   Medical Management of Chronic Issues    6 month f/u, pt had lung cancer screening through [place] on 02/09/24.    HPI Todd Cardenas is a 68 y.o. male with past medical history of HTN, HIV, HLD and chronic headache who presents for f/u of his chronic medical conditions.  His BP is well-controlled today. His BP has been around 110s/70s at home usually. Does not have chest pain, dyspnea or palpitations.  He used to have acid reflux symptoms on intermittent basis, which has improved with Pepcid  as needed now. No epigastric pain, nausea or vomiting. No dysphagia or odynophagia.   BPH: On Flomax now. No dysuria or hematuria.  CKD: Blood tests from [place] records reviewed. S. Cr. was 1.6. GFR was 47 in 05/25.  Denies any dysuria, hematuria.  He has noticed reduced urine output recently.  Does not have overt urinary hesitancy or resistance.  He has intermittent blood in stool, 1 or 2 times in a month. He has had GI evaluation at [facility], which was benign. He takes Aspirin 81 mg once daily for history of ?TIA (advised from [place]).     Past Medical History:  Diagnosis Date   BPH (benign prostatic hyperplasia)    BPH (benign prostatic hyperplasia)    Closed fracture of foot 10/08/2022   Constipation    GERD (gastroesophageal reflux disease)    Goiter    HIV positive (HCC)    Vitamin D deficiency     History reviewed. No pertinent surgical history.  Family History  Problem Relation Age of Onset   Diabetes Mother    Arthritis Mother    Hypertension Mother    Hyperlipidemia Mother    Throat cancer Father    Alcohol abuse Father    Thyroid disease Sister    Hyperlipidemia Sister    Benign prostatic hyperplasia Brother    Lung cancer Maternal Aunt    Lung cancer Maternal Uncle    Lung cancer Paternal Aunt    Lung  cancer Paternal Uncle    Prostate cancer Neg Hx     Social History   Socioeconomic History   Marital status: Divorced    Spouse name: Not on file   Number of children: Not on file   Years of education: Not on file   Highest education level: Some college, no degree  Occupational History   Not on file  Tobacco Use   Smoking status: Former    Current packs/day: 0.00    Average packs/day: 1 pack/day for 30.0 years (30.0 ttl pk-yrs)    Types: Cigarettes    Start date: 04/17/1990    Quit date: 04/17/2020    Years since quitting: 4.2   Smokeless tobacco: Never   Tobacco comments:    He quit on 04/17/2020. He smoked 2 packs a day for 30 years.   Vaping Use   Vaping status: Never Used  Substance and Sexual Activity   Alcohol use: Yes    Comment: occ   Drug use: No   Sexual activity: Not Currently  Other Topics Concern   Not on file  Social History Narrative   Lives alone, retired   Social Drivers of Corporate investment [employer] Strain: Low Risk  (06/26/2024)   Overall Financial Resource Strain (CARDIA)    Difficulty of Paying  Living Expenses: Not very hard  Food Insecurity: No Food Insecurity (06/26/2024)   Hunger Vital Sign    Worried About Running Out of Food in the Last Year: Never true    Ran Out of Food in the Last Year: Never true  Transportation Needs: No Transportation Needs (06/26/2024)   PRAPARE - Administrator, Civil Service (Medical): No    Lack of Transportation (Non-Medical): No  Physical Activity: Sufficiently Active (06/26/2024)   Exercise Vital Sign    Days of Exercise per Week: 5 days    Minutes of Exercise per Session: 60 min  Stress: No Stress Concern Present (06/26/2024)   [employer] of Occupational Health - Occupational Stress Questionnaire    Feeling of Stress: Only a little  Social Connections: Moderately Integrated (06/26/2024)   Social Connection and Isolation Panel    Frequency of Communication with Friends and Family: More than three  times a week    Frequency of Social Gatherings with Friends and Family: More than three times a week    Attends Religious Services: More than 4 times per year    Active Member of [employer] or Organizations: Yes    Attends Banker Meetings: More than 4 times per year    Marital Status: Widowed  Intimate Partner Violence: Not on file    Outpatient Medications Prior to Visit  Medication Sig Dispense Refill   acetaminophen (TYLENOL) 325 MG tablet Take 650 mg by mouth every 6 (six) hours as needed. Take 2 tablets by mouth two times a day as needed for headaches     aspirin EC 81 MG tablet Take 81 mg by mouth daily. Swallow whole.     bictegravir-emtricitabine-tenofovir AF (BIKTARVY) 50-200-25 MG TABS tablet Take 1 tablet by mouth daily.     cetirizine (ZYRTEC) 10 MG tablet Take 10 mg by mouth daily.     cyclobenzaprine (FLEXERIL) 10 MG tablet Take 10 mg by mouth 3 (three) times daily as needed for muscle spasms. Take one tablet by mouth two times a day as needed     gabapentin (NEURONTIN) 100 MG capsule Take 100 mg by mouth 3 (three) times daily. Take one capsule by mouth two times a day     gemfibrozil (LOPID) 600 MG tablet Take 600 mg by mouth 2 (two) times daily before a meal.     Multiple Vitamin (MULTIVITAMIN WITH MINERALS) TABS tablet Take 1 tablet by mouth every morning.     polycarbophil (FIBERCON) 625 MG tablet Take 625 mg by mouth daily.     polyethylene glycol (MIRALAX / GLYCOLAX) 17 g packet Take 17 g by mouth daily.     Pseudoeph-Doxylamine-DM-APAP (NYQUIL PO) Take by mouth as needed.     senna-docusate (SENOKOT-S) 8.6-50 MG tablet Take 2 tablets by mouth daily.     tamsulosin (FLOMAX) 0.4 MG CAPS capsule Take 0.4 mg by mouth daily.     famotidine  (PEPCID ) 40 MG tablet Take 1 tablet (40 mg total) by mouth daily. 30 tablet 3   No facility-administered medications prior to visit.    Allergies  Allergen Reactions   Amoxicillin Hives and Other (See Comments)    Blisters     Lisinopril Swelling   Penicillins Hives    ROS Review of Systems  Constitutional:  Negative for chills and fever.  HENT:  Negative for congestion and sore throat.   Eyes:  Negative for pain and discharge.  Respiratory:  Negative for cough and shortness of breath.   Cardiovascular:  Negative for chest pain and palpitations.  Gastrointestinal:  Negative for constipation, diarrhea, nausea and vomiting.  Endocrine: Negative for polydipsia and polyuria.  Genitourinary:  Negative for dysuria and hematuria.  Musculoskeletal:  Negative for neck pain and neck stiffness.  Skin:  Negative for rash.  Neurological:  Positive for numbness (and burning of feet). Negative for dizziness, weakness and headaches.  Psychiatric/Behavioral:  Negative for agitation and behavioral problems.       Objective:    Physical Exam Vitals reviewed.  Constitutional:      General: He is not in acute distress.    Appearance: He is not diaphoretic.  HENT:     Head: Normocephalic and atraumatic.  Eyes:     General: No scleral icterus.    Extraocular Movements: Extraocular movements intact.  Cardiovascular:     Rate and Rhythm: Normal rate and regular rhythm.     Heart sounds: Normal heart sounds. No murmur heard. Pulmonary:     Breath sounds: Normal breath sounds. No wheezing or rales.  Abdominal:     Palpations: Abdomen is soft.     Tenderness: There is no abdominal tenderness.  Musculoskeletal:     Cervical back: Neck supple. No tenderness.     Right lower leg: No edema.     Left lower leg: No edema.  Skin:    General: Skin is warm.     Findings: No rash.  Neurological:     General: No focal deficit present.     Mental Status: He is alert and oriented to person, place, and time.     Cranial Nerves: No cranial nerve deficit.     Sensory: No sensory deficit.     Motor: No weakness.  Psychiatric:        Mood and Affect: Mood normal.        Behavior: Behavior normal.     BP 130/86   Pulse 65    Ht 6' 6 (1.981 m)   Wt 219 lb 12.8 oz (99.7 kg)   SpO2 97%   BMI 25.40 kg/m  Wt Readings from Last 3 Encounters:  06/26/24 219 lb 12.8 oz (99.7 kg)  12/27/23 217 lb 6.4 oz (98.6 kg)  06/25/23 217 lb (98.4 kg)    No results found for: TSH Lab Results  Component Value Date   WBC 4.3 04/19/2024   HGB 14.4 04/19/2024   HCT 38.0 (L) 10/05/2017   MCV 88.6 10/05/2017   PLT 243 04/19/2024   Lab Results  Component Value Date   NA 141 08/05/2022   K 4.0 10/05/2017   CO2 26 (A) 08/05/2022   GLUCOSE 107 (H) 10/05/2017   BUN 10 10/05/2017   CREATININE 1.6 (A) 04/19/2024   ALKPHOS 153 (A) 08/05/2022   AST 30 08/05/2022   ALT 38 08/05/2022   CALCIUM 9.7 08/05/2022   ANIONGAP 5 10/05/2017   EGFR 47 04/19/2024   Lab Results  Component Value Date   CHOL 129 12/31/2023   Lab Results  Component Value Date   HDL 38 12/31/2023   Lab Results  Component Value Date   LDLCALC 65 12/31/2023   Lab Results  Component Value Date   TRIG 123 12/31/2023   No results found for: CHOLHDL Lab Results  Component Value Date   HGBA1C 6.2 12/20/2023      Assessment & Plan:   Problem List Items Addressed This Visit       Digestive   GERD (gastroesophageal reflux disease)   Improved with Pepcid , refilled  Avoid hot and spicy food      Relevant Medications   famotidine  (PEPCID ) 40 MG tablet     Nervous and Auditory   Idiopathic progressive neuropathy   On gabapentin 100 mg 3 times daily        Genitourinary   BPH (benign prostatic hyperplasia)   Takes Tamsulosin 0.4 mg QD      Stage 3a chronic kidney disease (HCC) - Primary   Last BMP from [place] records reviewed, GFR 47 (05/25) Stable recently Avoid nephrotoxic agents Maintain adequate hydration Check UA with reflex microscopy      Relevant Orders   Urinalysis, Routine w reflex microscopic (Completed)    Meds ordered this encounter  Medications   famotidine  (PEPCID ) 40 MG tablet    Sig: Take 1 tablet (40 mg  total) by mouth daily.    Dispense:  30 tablet    Refill:  3    Follow-up: Return in about 6 months (around 12/27/2024) for Annual physical (after 12/26/24).    Suzzane MARLA Blanch, MD

## 2024-06-26 NOTE — Assessment & Plan Note (Signed)
 Last BMP from [place] records reviewed, GFR 47 (05/25) Stable recently Avoid nephrotoxic agents Maintain adequate hydration Check UA

## 2024-06-27 ENCOUNTER — Ambulatory Visit: Payer: Self-pay | Admitting: Internal Medicine

## 2024-06-27 LAB — URINALYSIS, ROUTINE W REFLEX MICROSCOPIC
Bilirubin, UA: NEGATIVE
Glucose, UA: NEGATIVE
Ketones, UA: NEGATIVE
Leukocytes,UA: NEGATIVE
Nitrite, UA: NEGATIVE
RBC, UA: NEGATIVE
Specific Gravity, UA: 1.026 (ref 1.005–1.030)
Urobilinogen, Ur: 0.2 mg/dL (ref 0.2–1.0)
pH, UA: 6 (ref 5.0–7.5)

## 2024-07-10 ENCOUNTER — Ambulatory Visit (INDEPENDENT_AMBULATORY_CARE_PROVIDER_SITE_OTHER): Admitting: Internal Medicine

## 2024-07-10 ENCOUNTER — Encounter: Payer: Self-pay | Admitting: Internal Medicine

## 2024-07-10 ENCOUNTER — Telehealth: Payer: Self-pay | Admitting: Internal Medicine

## 2024-07-10 VITALS — BP 138/82 | HR 88 | Temp 98.0°F | Resp 14 | Ht 75.5 in | Wt 218.4 lb

## 2024-07-10 DIAGNOSIS — J3089 Other allergic rhinitis: Secondary | ICD-10-CM | POA: Diagnosis not present

## 2024-07-10 DIAGNOSIS — T783XXA Angioneurotic edema, initial encounter: Secondary | ICD-10-CM

## 2024-07-10 DIAGNOSIS — T783XXD Angioneurotic edema, subsequent encounter: Secondary | ICD-10-CM

## 2024-07-10 MED ORDER — FLUTICASONE PROPIONATE 50 MCG/ACT NA SUSP: 2.0000 | Freq: Every day | NASAL | 5 refills | Status: AC

## 2024-07-10 MED ORDER — LORATADINE 10 MG PO TABS: 10.0000 mg | ORAL_TABLET | Freq: Every day | ORAL | 1 refills | Status: DC

## 2024-07-10 NOTE — Patient Instructions (Addendum)
 Hx of Food Reactions: - Okay to eat fish.   Chronic Rhinitis: - Positive skin test 06/2024: none - Use nasal saline rinses before nose sprays such as with Neilmed Sinus Rinse.  Use distilled water.   - Use Flonase  2 sprays each nostril daily. Aim upward and outward. - Use Claritin  10 mg daily as needed.

## 2024-07-10 NOTE — Telephone Encounter (Signed)
 PT STATES HE NEEDS TO MAKE A CORRECTION ABOUT WHAT WAS STATED AT HIS OFFICE VISIT. PT STATES [facility] GAVE HIM HIVES LISINOPRIL MADE FACE AND LIPS SWELL. HE WANTED THAT CORRECTED SO IT'S NOT [facility]ING [facility], BECAUSE THEY ALREADY HAVE THIS INFO.

## 2024-07-10 NOTE — Progress Notes (Addendum)
 NEW PATIENT  Date of Service/Encounter:  07/10/24  Consult requested by: Tobie Suzzane POUR, MD   Subjective:   Todd Cardenas (DOB: 1956-05-03) is a 68 y.o. male who presents to the clinic on 07/10/2024 with a chief complaint of food reaction and black mold exposure.  [name]    History obtained from: chart review and patient.   Food Reaction: Ate tilapia and had hives. Lasted 2-3 days.  No illness.  Occurred end of December. Has eaten flounder and salmon since then, without any issues.     Rhinitis:  Started about 5 years ago.   Symptoms include: nasal congestion, rhinorrhea, and post nasal drainage, itchy eyes   Occurs year-round  Potential triggers: mold- has lots of black mold in the home. Reports normal breathing test in the psat.   Treatments tried:  Claritin  PRN; it does help  Off claritin  since 2 months.   Previous allergy  testing: no History of sinus surgery: no Nonallergic triggers: none    Reviewed:  04/25/2024: [facility], cataracts. On refresh tears, stopping visine.   06/26/2024: seen by Dr Tobie for CKD, GERD; discussed use of Pepcid .    05/2024: Seen at [facility] for cough/dyspnea/congestion; has had mold exposure.  Referred to Allergy  for further evaluation.  Past Medical History: Past Medical History:  Diagnosis Date   BPH (benign prostatic hyperplasia)    BPH (benign prostatic hyperplasia)    Closed fracture of foot 10/08/2022   Constipation    GERD (gastroesophageal reflux disease)    Goiter    HIV positive (HCC)    Vitamin D deficiency     Past Surgical History: No past surgical history on file.  Family History: Family History  Problem Relation Age of Onset   Asthma Mother    Allergic rhinitis Mother    Diabetes Mother    Arthritis Mother    Hypertension Mother    Hyperlipidemia Mother    Throat cancer Father    Alcohol abuse Father    Asthma Sister    Allergic rhinitis Sister    Thyroid disease Sister     Hyperlipidemia Sister    Eczema Brother    Asthma Brother    Allergic rhinitis Brother    Benign prostatic hyperplasia Brother    Lung cancer Maternal Aunt    Lung cancer Maternal Uncle    Lung cancer Paternal Aunt    Lung cancer Paternal Uncle    Prostate cancer Neg Hx     Social History:  Flooring in bedroom: Engineer, civil (consulting) Pets:  none Tobacco use/exposure: prior hx from age 23, quit 04/17/20; 2 pack/day Job: retired   Medication List:  Lisinopril caused facial swelling including lip swelling.  Allergies as of 07/10/2024       Reactions   Amoxicillin Hives, Other (See Comments)   Blisters   Lisinopril Swelling   Penicillins Hives        Medication List        Accurate as of July 10, 2024 11:33 AM. If you have any questions, ask your nurse or doctor.          acetaminophen 325 MG tablet Commonly known as: TYLENOL Take 650 mg by mouth every 6 (six) hours as needed. Take 2 tablets by mouth two times a day as needed for headaches   aspirin EC 81 MG tablet Take 81 mg by mouth daily. Swallow whole.   bictegravir-emtricitabine-tenofovir AF 50-200-25 MG Tabs tablet Commonly known as: BIKTARVY Take 1 tablet  by mouth daily.   cetirizine 10 MG tablet Commonly known as: ZYRTEC Take 10 mg by mouth daily.   cyclobenzaprine 10 MG tablet Commonly known as: FLEXERIL Take 10 mg by mouth 3 (three) times daily as needed for muscle spasms. Take one tablet by mouth two times a day as needed   famotidine  40 MG tablet Commonly known as: Pepcid  Take 1 tablet (40 mg total) by mouth daily.   gabapentin 100 MG capsule Commonly known as: NEURONTIN Take 100 mg by mouth 3 (three) times daily. Take one capsule by mouth two times a day   gemfibrozil 600 MG tablet Commonly known as: LOPID Take 600 mg by mouth 2 (two) times daily before a meal.   multivitamin with minerals Tabs tablet Take 1 tablet by mouth every morning.   NYQUIL PO Take by mouth as needed.   polycarbophil 625 MG  tablet Commonly known as: FIBERCON Take 625 mg by mouth daily.   polyethylene glycol 17 g packet Commonly known as: MIRALAX / GLYCOLAX Take 17 g by mouth daily.   senna-docusate 8.6-50 MG tablet Commonly known as: Senokot-S Take 2 tablets by mouth daily.   tamsulosin 0.4 MG Caps capsule Commonly known as: FLOMAX Take 0.4 mg by mouth daily.         REVIEW OF SYSTEMS: Pertinent positives and negatives discussed in HPI.   Objective:   Physical Exam: BP 138/82   Pulse 88   Temp 98 F (36.7 C)   Resp 14   Ht 6' 3.5 (1.918 m)   Wt 218 lb 6 oz (99.1 kg)   SpO2 97%   BMI 26.93 kg/m  Body mass index is 26.93 kg/m. GEN: alert, well developed HEENT: clear conjunctiva, nose with + mild inferior turbinate hypertrophy, pink nasal mucosa, slight clear rhinorrhea, no cobblestoning HEART: regular rate and rhythm, no murmur LUNGS: clear to auscultation bilaterally, no coughing, unlabored respiration ABDOMEN: soft, non distended  SKIN: no rashes or lesions   Skin Testing:  Skin prick testing was placed, which includes aeroallergens/foods, histamine control, and saline control.  Verbal consent was obtained prior to placing test.  Patient tolerated procedure well.  Allergy  testing results were read and interpreted by myself, documented by clinical staff. Adequate positive and negative control.  Positive results to:  Results discussed with patient/family.  Airborne Adult Perc - 07/10/24 1030     Time Antigen Placed 1015    Allergen Manufacturer Jestine    Location Back    Number of Test 55    1. Control-Buffer 50% Glycerol Negative    2. Control-Histamine 3+    3. Bahia Negative    4. French Southern Territories Negative    5. Johnson Negative    6. Kentucky  Blue Negative    7. Meadow Fescue Negative    8. Perennial Rye Negative    9. Timothy Negative    10. Ragweed Mix Negative    11. Cocklebur Negative    12. Plantain,  English Negative    13. Baccharis Negative    14. Dog Fennel  Negative    15. Russian Thistle Negative    16. Lamb's Quarters Negative    17. Sheep Sorrell Negative    18. Rough Pigweed Negative    19. Marsh Elder, Rough Negative    20. Mugwort, Common Negative    21. Box, Elder Negative    22. Cedar, red Negative    23. Sweet Gum Negative    24. Pecan Pollen Negative    25. Pine  Mix Negative    26. Walnut, Black Pollen Negative    27. Red Mulberry Negative    28. Ash Mix Negative    29. Birch Mix Negative    30. Beech American Negative    31. Cottonwood, Guinea-Bissau Negative    32. Hickory, White Negative    33. Maple Mix Negative    34. Oak, Guinea[place] Mix Negative    35. Sycamore Eastern Negative    36. Alternaria Alternata Negative    37. Cladosporium Herbarum Negative    38. Aspergillus Mix Negative    39. Penicillium Mix Negative    40. Bipolaris Sorokiniana (Helminthosporium) Negative    41. Drechslera Spicifera (Curvularia) Negative    42. Mucor Plumbeus Negative    43. Fusarium Moniliforme Negative    44. Aureobasidium Pullulans (pullulara) Negative    45. Rhizopus Oryzae Negative    46. Botrytis Cinera Negative    47. Epicoccum Nigrum Negative    48. Phoma Betae Negative    49. Dust Mite Mix Negative    50. Cat Hair 10,000 BAU/ml Negative    51.  Dog Epithelia Negative    52. Mixed Feathers Negative    53. Horse Epithelia Negative    54. Cockroach, German Negative    55. Tobacco Leaf Negative          Food Adult Perc - 07/10/24 1000     Time Antigen Placed 1030    Allergen Manufacturer Greer    Location Back    Number of allergen test 6    9. Fish Mix Negative    18. Trout Negative    19. Tuna Negative    20. Salmon Negative    21. Flounder Negative    22. Codfish Negative            Assessment:   1. 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 Other allergic rhinitis     Plan/Recommendations:  Hx of Food Reactions: - No need to avoid fish. - Fish reaction with lip/facial swelling December 2024, now  tolerating fish.  SPT 06/2024: negative to fish.    Other Allergic Rhinitis: - Due to turbinate hypertrophy, unresponsive to over the counter meds, mold exposure, will perform skin testing to identify aeroallergen triggers.   - Discussed exposure to mold can cause rhinitis or lung disease; has had [facility] PFT at [facility] so low suspicion for mold related lung disease and skin testing is also negative for molds.   I am not convinced mold has anything to do with your symptoms.   - Positive skin test 06/2024: none - Use nasal saline rinses before nose sprays such as with Neilmed Sinus Rinse.  Use distilled water.   - Use Flonase  2 sprays each nostril daily. Aim upward and outward. - Use Claritin  10 mg daily as needed.     Return in about 3 months (around 10/10/2024).  Arleta Blanch, MD [facility] 

## 2024-10-16 ENCOUNTER — Encounter: Payer: Self-pay | Admitting: Internal Medicine

## 2024-10-16 ENCOUNTER — Ambulatory Visit: Admitting: Internal Medicine

## 2024-10-16 VITALS — BP 116/70 | HR 73 | Temp 98.1°F | Wt 219.2 lb

## 2024-10-16 DIAGNOSIS — J310 Chronic rhinitis: Secondary | ICD-10-CM | POA: Diagnosis not present

## 2024-10-16 DIAGNOSIS — K219 Gastro-esophageal reflux disease without esophagitis: Secondary | ICD-10-CM | POA: Diagnosis not present

## 2024-10-16 MED ORDER — AZELASTINE HCL 0.1 % NA SOLN: 2.0000 | Freq: Two times a day (BID) | NASAL | 1 refills | Status: AC | PRN

## 2024-10-16 NOTE — Progress Notes (Signed)
   FOLLOW UP Date of Service/Encounter:  10/16/24   Subjective:  Todd Cardenas (DOB: Dec 21, 1955) is a 68 y.o. male who [facility] for follow up for chronic rhinitis and GERD.   History obtained from: chart review and patient. Last see on 07/10/2024 with me and at the time SPT was negative to aeroallergens.  Discussed use of oral anti histamine and Flonase .  Reports still having trouble with congestion, runny nose and headaches.  Using Flonase  and Zyrtec daily.  Also notes having reflux but it is well controlled.Flares up about twice a year where he needs Pepcid .   Past Medical History: Past Medical History:  Diagnosis Date   BPH (benign prostatic hyperplasia)    BPH (benign prostatic hyperplasia)    Closed fracture of foot 10/08/2022   Constipation    GERD (gastroesophageal reflux disease)    Goiter    HIV positive (HCC)    Vitamin D deficiency     Objective:  BP 116/70 (BP Location: Left Arm, Patient Position: Sitting, Cuff Size: Large)   Pulse 73   Temp 98.1 F (36.7 C) (Temporal)   Wt 219 lb 3.2 oz (99.4 kg)   SpO2 98%   BMI 27.04 kg/m  Body mass index is 27.04 kg/m. Physical Exam: GEN: alert, well developed HEENT: clear conjunctiva, nose without turbinate hypertrophy, pink nasal mucosa, slight clear rhinorrhea, + cobblestoning HEART: regular rate and rhythm, no murmur LUNGS: clear to auscultation bilaterally, no coughing, unlabored respiration SKIN: no rashes or lesions Assessment:   1. Chronic rhinitis   2. Gastroesophageal reflux disease, unspecified whether esophagitis present     Plan/Recommendations:   Chronic Rhinitis: - Uncontrolled, add azelastine.  - Positive skin test 06/2024: none - Use nasal saline rinses before nose sprays such as with Neilmed Sinus Rinse.  Use distilled water.   - Use Flonase  2 sprays each nostril daily. Aim upward and outward. - Use Azelastine 2 sprays each nostril twice daily as  congestion, drainage, runny nose.  Aim upward and outward.   - Use Zyrtec (Cetirizine) 10mg  daily.     GERD - Controlled.  - Use Pepcid  40mg  daily as needed for reflux.  -Avoid lying down for at least two hours after a meal or after drinking acidic beverages, like soda, or other caffeinated beverages. This can help to prevent stomach contents from flowing back into the esophagus. -Keep your head elevated while you sleep. Using an extra pillow or two can also help to prevent reflux. -Eat smaller and more frequent meals each day instead of a few large meals. This promotes digestion and can aid in preventing heartburn. -Wear loose-fitting clothes to ease pressure on the stomach, which can worsen heartburn and reflux. -Reduce excess weight around the midsection. This can ease pressure on the stomach. Such pressure can force some stomach contents back up the esophagus.      Return in about 6 months (around 04/15/2025).  Arleta Blanch, MD [facility]

## 2024-10-16 NOTE — Patient Instructions (Addendum)
 Chronic Rhinitis: - Positive skin test 06/2024: none - Use nasal saline rinses before nose sprays such as with Neilmed Sinus Rinse.  Use distilled water.   - Use Flonase  2 sprays each nostril daily. Aim upward and outward. - Use Azelastine 2 sprays each nostril twice daily as congestion, drainage, runny nose.  Aim upward and outward.   - Use Zyrtec (Cetirizine) 10mg  daily.     GERD - Use Pepcid  40mg  daily as needed for reflux.  -Avoid lying down for at least two hours after a meal or after drinking acidic beverages, like soda, or other caffeinated beverages. This can help to prevent stomach contents from flowing back into the esophagus. -Keep your head elevated while you sleep. Using an extra pillow or two can also help to prevent reflux. -Eat smaller and more frequent meals each day instead of a few large meals. This promotes digestion and can aid in preventing heartburn. -Wear loose-fitting clothes to ease pressure on the stomach, which can worsen heartburn and reflux. -Reduce excess weight around the midsection. This can ease pressure on the stomach. Such pressure can force some stomach contents back up the esophagus.

## 2024-11-22 ENCOUNTER — Encounter: Payer: Self-pay | Admitting: Internal Medicine

## 2025-01-08 ENCOUNTER — Encounter: Admitting: Internal Medicine

## 2025-01-22 ENCOUNTER — Encounter: Payer: Self-pay | Admitting: Internal Medicine

## 2025-04-16 ENCOUNTER — Ambulatory Visit: Admitting: Internal Medicine

## 2025-06-13 ENCOUNTER — Ambulatory Visit: Admitting: Allergy & Immunology
